# Patient Record
Sex: Female | Born: 1960 | Race: Black or African American | Hispanic: No | Marital: Single | Smoking: Former smoker
Health system: Southern US, Community
[De-identification: ages and names within clinical notes are randomized; demographics above are authoritative.]

## PROBLEM LIST (undated history)

## (undated) DIAGNOSIS — I10 Essential (primary) hypertension: Secondary | ICD-10-CM

## (undated) DIAGNOSIS — K219 Gastro-esophageal reflux disease without esophagitis: Secondary | ICD-10-CM

## (undated) DIAGNOSIS — E7800 Pure hypercholesterolemia, unspecified: Secondary | ICD-10-CM

---

## 2004-10-15 ENCOUNTER — Ambulatory Visit: Payer: Self-pay | Admitting: Internal Medicine

## 2005-03-15 ENCOUNTER — Emergency Department (HOSPITAL_COMMUNITY): Admission: EM | Admit: 2005-03-15 | Discharge: 2005-03-15 | Payer: Self-pay | Admitting: Emergency Medicine

## 2005-10-18 ENCOUNTER — Ambulatory Visit: Payer: Self-pay | Admitting: Internal Medicine

## 2005-10-18 ENCOUNTER — Emergency Department (HOSPITAL_COMMUNITY): Admission: EM | Admit: 2005-10-18 | Discharge: 2005-10-18 | Payer: Self-pay | Admitting: Emergency Medicine

## 2005-11-02 ENCOUNTER — Ambulatory Visit: Payer: Self-pay | Admitting: Internal Medicine

## 2005-12-02 ENCOUNTER — Ambulatory Visit: Payer: Self-pay | Admitting: Internal Medicine

## 2006-04-07 ENCOUNTER — Ambulatory Visit: Payer: Self-pay | Admitting: Internal Medicine

## 2006-04-27 ENCOUNTER — Ambulatory Visit: Payer: Self-pay | Admitting: Gastroenterology

## 2006-05-02 ENCOUNTER — Ambulatory Visit (HOSPITAL_COMMUNITY): Admission: RE | Admit: 2006-05-02 | Discharge: 2006-05-02 | Payer: Self-pay | Admitting: Gastroenterology

## 2006-05-12 ENCOUNTER — Ambulatory Visit: Payer: Self-pay | Admitting: Gastroenterology

## 2006-06-13 ENCOUNTER — Ambulatory Visit: Payer: Self-pay | Admitting: Gastroenterology

## 2006-06-15 ENCOUNTER — Ambulatory Visit (HOSPITAL_COMMUNITY): Admission: RE | Admit: 2006-06-15 | Discharge: 2006-06-15 | Payer: Self-pay | Admitting: Gastroenterology

## 2006-06-27 ENCOUNTER — Ambulatory Visit: Payer: Self-pay | Admitting: Gastroenterology

## 2006-07-08 ENCOUNTER — Ambulatory Visit (HOSPITAL_COMMUNITY): Admission: RE | Admit: 2006-07-08 | Discharge: 2006-07-08 | Payer: Self-pay | Admitting: Gastroenterology

## 2006-07-08 ENCOUNTER — Encounter: Payer: Self-pay | Admitting: Physician Assistant

## 2006-07-20 ENCOUNTER — Ambulatory Visit: Payer: Self-pay | Admitting: Gastroenterology

## 2007-04-12 ENCOUNTER — Encounter: Payer: Self-pay | Admitting: Internal Medicine

## 2007-04-12 DIAGNOSIS — I10 Essential (primary) hypertension: Secondary | ICD-10-CM | POA: Insufficient documentation

## 2007-04-12 DIAGNOSIS — E119 Type 2 diabetes mellitus without complications: Secondary | ICD-10-CM

## 2008-01-16 ENCOUNTER — Telehealth (INDEPENDENT_AMBULATORY_CARE_PROVIDER_SITE_OTHER): Payer: Self-pay | Admitting: *Deleted

## 2008-01-17 ENCOUNTER — Ambulatory Visit: Payer: Self-pay | Admitting: Internal Medicine

## 2008-01-17 DIAGNOSIS — K219 Gastro-esophageal reflux disease without esophagitis: Secondary | ICD-10-CM | POA: Insufficient documentation

## 2008-04-22 ENCOUNTER — Emergency Department (HOSPITAL_COMMUNITY): Admission: EM | Admit: 2008-04-22 | Discharge: 2008-04-22 | Payer: Self-pay | Admitting: Emergency Medicine

## 2008-05-09 ENCOUNTER — Telehealth (INDEPENDENT_AMBULATORY_CARE_PROVIDER_SITE_OTHER): Payer: Self-pay | Admitting: *Deleted

## 2008-05-09 ENCOUNTER — Encounter: Payer: Self-pay | Admitting: Internal Medicine

## 2008-05-10 ENCOUNTER — Telehealth (INDEPENDENT_AMBULATORY_CARE_PROVIDER_SITE_OTHER): Payer: Self-pay | Admitting: *Deleted

## 2009-01-11 ENCOUNTER — Emergency Department (HOSPITAL_COMMUNITY): Admission: EM | Admit: 2009-01-11 | Discharge: 2009-01-11 | Payer: Self-pay | Admitting: Family Medicine

## 2009-04-18 ENCOUNTER — Encounter: Payer: Self-pay | Admitting: Physician Assistant

## 2009-05-12 ENCOUNTER — Ambulatory Visit: Payer: Self-pay | Admitting: Physician Assistant

## 2009-05-12 ENCOUNTER — Telehealth: Payer: Self-pay | Admitting: Physician Assistant

## 2009-05-12 DIAGNOSIS — K029 Dental caries, unspecified: Secondary | ICD-10-CM | POA: Insufficient documentation

## 2009-05-13 ENCOUNTER — Encounter (INDEPENDENT_AMBULATORY_CARE_PROVIDER_SITE_OTHER): Payer: Self-pay | Admitting: *Deleted

## 2009-05-13 ENCOUNTER — Encounter: Payer: Self-pay | Admitting: Physician Assistant

## 2009-05-15 ENCOUNTER — Ambulatory Visit: Payer: Self-pay | Admitting: Internal Medicine

## 2009-05-21 ENCOUNTER — Encounter (INDEPENDENT_AMBULATORY_CARE_PROVIDER_SITE_OTHER): Payer: Self-pay | Admitting: Internal Medicine

## 2009-06-18 ENCOUNTER — Ambulatory Visit: Payer: Self-pay | Admitting: Physician Assistant

## 2009-06-18 DIAGNOSIS — B353 Tinea pedis: Secondary | ICD-10-CM | POA: Insufficient documentation

## 2009-06-18 DIAGNOSIS — L84 Corns and callosities: Secondary | ICD-10-CM | POA: Insufficient documentation

## 2009-06-18 LAB — CONVERTED CEMR LAB
Albumin: 4 g/dL (ref 3.5–5.2)
Alkaline Phosphatase: 84 units/L (ref 39–117)
BUN: 14 mg/dL (ref 6–23)
Basophils Absolute: 0.1 10*3/uL (ref 0.0–0.1)
Blood Glucose, Fingerstick: 102
CO2: 27 meq/L (ref 19–32)
Calcium: 10 mg/dL (ref 8.4–10.5)
Chloride: 107 meq/L (ref 96–112)
Eosinophils Relative: 2 % (ref 0–5)
Glucose, Bld: 90 mg/dL (ref 70–99)
HCT: 38.9 % (ref 36.0–46.0)
Hemoglobin: 12.6 g/dL (ref 12.0–15.0)
Lymphocytes Relative: 57 % — ABNORMAL HIGH (ref 12–46)
Microalb, Ur: 0.5 mg/dL (ref 0.00–1.89)
Monocytes Absolute: 0.4 10*3/uL (ref 0.1–1.0)
Monocytes Relative: 8 % (ref 3–12)
Potassium: 4.2 meq/L (ref 3.5–5.3)
RDW: 14.6 % (ref 11.5–15.5)
TSH: 2.181 microintl units/mL (ref 0.350–4.500)

## 2009-06-19 ENCOUNTER — Encounter: Payer: Self-pay | Admitting: Physician Assistant

## 2009-06-26 ENCOUNTER — Encounter: Payer: Self-pay | Admitting: Physician Assistant

## 2009-08-06 ENCOUNTER — Encounter: Payer: Self-pay | Admitting: Physician Assistant

## 2009-08-06 ENCOUNTER — Ambulatory Visit: Payer: Self-pay | Admitting: Internal Medicine

## 2009-11-19 ENCOUNTER — Encounter: Payer: Self-pay | Admitting: Physician Assistant

## 2009-12-02 ENCOUNTER — Ambulatory Visit: Payer: Self-pay | Admitting: Physician Assistant

## 2010-09-22 NOTE — Letter (Signed)
Summary: REFERAL//PODIATRY//APPT DATE & TIME  REFERAL//PODIATRY//APPT DATE & TIME   Imported By: Arta Bruce 12/03/2009 15:47:53  _____________________________________________________________________  External Attachment:    Type:   Image     Comment:   External Document

## 2010-09-22 NOTE — Assessment & Plan Note (Signed)
Summary: 3 BAD TEETH AND NEED MEDS REFILLED//SS   Vital Signs:  Patient profile:   50 year old female Height:      66 inches Weight:      258 pounds BMI:     41.79 Temp:     98.1 degrees F oral Pulse rate:   75 / minute Pulse rhythm:   regular Resp:     18 per minute BP sitting:   135 / 85  (left arm) Cuff size:   large  Vitals Entered By: [place] Shannon (December 02, 2009 3:02 PM)  Primary Care Provider:  Tereso Newcomer PA-C  CC:  renew meds.... pt has been to denist already...  History of Present Illness: Here for f/u.  Have not seen since Oct.  Eligibility ran out.  GERD:  Never had UGI or saw Dr. Corinda Gubler for EGD.  She is taking OTC Pepcid now.  Still notes problems with dysphagia . . . solids and liquids.  She feels like symptoms are approaching like what she had prior to esoph dilatation in 2007.  She is trying to chew food more, etc.  She is still having quite a bit of indigestion even when she takes the pepcid.  No melena or hematochezia.  No hematemesis.  Weight stable by our scales.  Dental:  Saw dentist yesterday and goes back next week.  DM: Diet controlled.  Occ checks sugars at home . . . 90-100.  Retasure done 04/2009.  Needs pneumovax.      Hypertension History:      She denies headache, chest pain, dyspnea with exertion, and syncope.        Positive major cardiovascular risk factors include diabetes and hypertension.  Negative major cardiovascular risk factors include female age less than 36 years old and non-tobacco-user status.     Problems Prior to Update: 1)  Tinea Pedis  (ICD-110.4) 2)  Callus, Foot  (ICD-700) 3)  Dental Caries  (ICD-521.00) 4)  Gerd  (ICD-530.81) 5)  Hypertension  (ICD-401.9) 6)  Diabetes Mellitus, Type II  (ICD-250.00)  Current Medications (verified): 1)  Atenolol 50 Mg  Tabs (Atenolol) .Marland Kitchen.. 1 By Mouth Qd 2)  Benazepril Hcl 40 Mg Tabs (Benazepril Hcl) .... Take 1 Tablet By Mouth Once A Day 3)  Hydrochlorothiazide 25 Mg Tabs  (Hydrochlorothiazide) .... One Tab By Mouth Once Daily 4)  Prilosec 20 Mg Cpdr (Omeprazole) .... Take 1 Tablet By Mouth Once A Day 5)  Lamisil At Athletes Foot 1 % Crea (Terbinafine Hcl) .... Apply Two Times A Day Until Clear  Allergies (verified): 1)  ! * Spider Bites 2)  ! * Bee Stings  Physical Exam  General:  alert, well-developed, and well-nourished.   Eyes:  pupils equal, pupils round, pupils reactive to light, and no optic disk abnormalities.   Neck:  supple and no thyromegaly.   Lungs:  normal breath sounds.   Heart:  normal rate and regular rhythm.   Abdomen:  soft, non-tender, and no hepatomegaly.   Extremities:  bilat feet with callus formation: left great toe and lateral sole distal to 5th phalanx right great toe Neurologic:  alert & oriented X3 and cranial nerves II-XII intact.   Psych:  normally interactive.     Impression & Recommendations:  Problem # 1:  GERD (ICD-530.81)  just taking pepcid still having dysphagia schedule upper gi series again change to Protonix review at f/u consider sending to GI depending on results of UGI or lack of improvement in symptoms  The following medications were removed from the medication list:    Prilosec 20 Mg Cpdr (Omeprazole) .Marland Kitchen... Take 1 tablet by mouth once a day Her updated medication list for this problem includes:    Protonix 40 Mg Tbec (Pantoprazole sodium) .Marland Kitchen... Take 1 tablet by mouth two times a day  Orders: Diagnostic X-Ray/Fluoroscopy (Diagnostic X-Ray/Flu)  Problem # 2:  HYPERTENSION (ICD-401.9) has had a lot of dental work recently prob up due to pain if cont to be above goal at next visit, increase atenolol to 75 mg once daily (consider adding norvasc)  Her updated medication list for this problem includes:    Atenolol 50 Mg Tabs (Atenolol) .Marland Kitchen... 1 by mouth qd    Benazepril Hcl 40 Mg Tabs (Benazepril hcl) .Marland Kitchen... Take 1 tablet by mouth once a day    Hydrochlorothiazide 25 Mg Tabs (Hydrochlorothiazide)  ..... One tab by mouth once daily  Problem # 3:  DIABETES MELLITUS, TYPE II (ICD-250.00) A1C 6  Her updated medication list for this problem includes:    Benazepril Hcl 40 Mg Tabs (Benazepril hcl) .Marland Kitchen... Take 1 tablet by mouth once a day  Problem # 4:  Preventive Health Care (ICD-V70.0) schedule CPP needs pneumovax  Problem # 5:  CALLUS, FOOT (ICD-700)  refer back to foot clinic at [facility]  Orders: Podiatry Referral (Podiatry)  Complete Medication List: 1)  Atenolol 50 Mg Tabs (Atenolol) .Marland Kitchen.. 1 by mouth qd 2)  Benazepril Hcl 40 Mg Tabs (Benazepril hcl) .... Take 1 tablet by mouth once a day 3)  Hydrochlorothiazide 25 Mg Tabs (Hydrochlorothiazide) .... One tab by mouth once daily 4)  Lamisil At Athletes Foot 1 % Crea (Terbinafine hcl) .... Apply two times a day until clear 5)  Protonix 40 Mg Tbec (Pantoprazole sodium) .... Take 1 tablet by mouth two times a day  Hypertension Assessment/Plan:      The patient's hypertensive risk group is category C: Target organ damage and/or diabetes.  Today's blood pressure is 135/85.  Her blood pressure goal is < 130/80.  Patient Instructions: 1)  Schedule pneumovax. 2)  Please schedule a follow-up appointment in 6 weeks with Zea Kostka for CPP. 3)  Schedule appointment at the podiatry clinic at [facility]. 4)  Your Hemoglobin A1C today is 6.  That is very good. 5)  Get the protonix (pantoprazole) and take two times a day for a week, then take once daily. Prescriptions: HYDROCHLOROTHIAZIDE 25 MG TABS (HYDROCHLOROTHIAZIDE) one tab by mouth once daily  #90 x 3   Entered and Authorized by:   Tereso Newcomer PA-C   Signed by:   Tereso Newcomer PA-C on 12/02/2009   Method used:   Print then Give to Patient   RxID:   [id number] BENAZEPRIL HCL 40 MG TABS (BENAZEPRIL HCL) Take 1 tablet by mouth once a day  #90 x 3   Entered and Authorized by:   Tereso Newcomer PA-C   Signed by:   Tereso Newcomer PA-C on 12/02/2009   Method used:   Print then Give to  Patient   RxID:   [id number] ATENOLOL 50 MG  TABS (ATENOLOL) 1 by mouth qd  #90 x 3   Entered and Authorized by:   Tereso Newcomer PA-C   Signed by:   Tereso Newcomer PA-C on 12/02/2009   Method used:   Print then Give to Patient   RxID:   [id number] PROTONIX 40 MG TBEC (PANTOPRAZOLE SODIUM) Take 1 tablet by mouth two times a day  #60 x 5  Entered and Authorized by:   Tereso Newcomer PA-C   Signed by:   Tereso Newcomer PA-C on 12/02/2009   Method used:   Print then Give to Patient   RxID:   [id number]

## 2010-09-22 NOTE — Letter (Signed)
Summary: PODIATRY  PODIATRY   Imported By: Arta Bruce 01/07/2010 14:31:14  _____________________________________________________________________  External Attachment:    Type:   Image     Comment:   External Document

## 2010-09-22 NOTE — Letter (Signed)
Summary: REFAXED REFERAL TO DENTAL CLINIC  REFAXED REFERAL TO DENTAL CLINIC   Imported By: Arta Bruce 11/19/2009 09:35:47  _____________________________________________________________________  External Attachment:    Type:   Image     Comment:   External Document

## 2011-01-08 NOTE — Assessment & Plan Note (Signed)
[facility]                           GASTROENTEROLOGY OFFICE NOTE   VINETA, CARONE                        MRN:          [id number]  DATE:04/27/2006                            DOB:          Jan 10, 1961    Ms. Samantha Castillo is a 50 year old [place] American female referred through the  courtesy of Dr. Jonny Ruiz for evaluation.  For at least 8 months she has been  complaining of pyrosis.  She has had a burning chest discomfort with  occasional nausea.  She complains of bloating.  Also has a fluttering  feeling in her chest.  She is taking Aciphex with only minimal relief.  Weight has been stable.  She is on no gastric irritants.  She does complain  of intermittent dysphagia to solids.   PAST MEDICAL HISTORY:  1. Diabetes.  2. Hypertension.   FAMILY HISTORY:  Pertinent for diabetes and heart disease in her father.   MEDICATIONS:  Potassium, diltiazem, Lisinopril, and Aciphex.   SHE HAS NO ALLERGIES.   She rarely smokes.  She drinks on occasion.  She does admit to being a heavy  drinker until about a year ago.  She is single and works as a Merchandiser, retail.   REVIEW OF SYSTEMS:  Positive for back pain and muscle cramps.   VITAL SIGNS:  Pulse 60, weight 267.   PHYSICAL EXAMINATION:  HEENT: EOMI. PERRLA. Sclerae are anicteric.  Conjunctivae are pink.  NECK:  Supple without thyromegaly, adenopathy or carotid bruits.  CHEST:  Clear to auscultation and percussion without adventitious sounds.  CARDIAC:  Regular rhythm; normal S1 S2.  There are no murmurs, gallops or  rubs.  ABDOMEN:  There is no succession splash.  Bowel sounds are normoactive.  Abdomen is soft, non-tender and non-distended.  There are no abdominal  masses, tenderness, splenic enlargement or hepatomegaly.  EXTREMITIES:  Full range of motion.  No cyanosis, clubbing or edema.  RECTAL:  Deferred.   IMPRESSION:  1. Gastroesophageal reflux disease.  2. Dysphagia, rule out early esophageal  stricture.  3. Bloating, rule out gastroparesis.   RECOMMENDATION:  1. Continue Aciphex.  2. Upper endoscopy.  3. Gastric emptying scan.                                   Barbette Hair. Arlyce Dice, MD,FACG   RDK/MedQ  DD:  04/27/2006  DT:  04/27/2006  Job #:  [id number]

## 2011-01-08 NOTE — Assessment & Plan Note (Signed)
 [facility]ALTHC[facility]                           GASTROENTEROLOGY OFFICE NOTE   Samantha Castillo, Samantha Castillo                        MRN:          [id number]  DATE:06/13/2006                            DOB:          11-May-1961    PROBLEM:  Dysphagia.   [name] has returned following upper endoscopy and Savary dilatation.  She was dilated to 18 mm.  She continues to report dysphagia to solids and  possibly liquids.  Gastric emptying scan was unremarkable.   EXAM:  Pulse 72, blood pressure 138/82, weight 265.   IMPRESSION:  Persistent dysphagia.  This raises the question of esophageal  motility disorder.   RECOMMENDATIONS:  Barium swallow.  I would consider esophageal manometry  pending results of the above.       Barbette Hair. Arlyce Dice, MD,FACG      RDK/MedQ  DD:  06/13/2006  DT:  06/13/2006  Job #:  [id number]

## 2011-01-08 NOTE — Assessment & Plan Note (Signed)
[facility]                                 ON-CALL NOTE   RIVA, SESMA                        MRN:          [id number]  DATE:12/31/2007                            DOB:          02-02-1961    Patient name is Samantha Castillo.  Date of birth 03/08/1961,  Date of  interaction Dec 31, 2007, at 4 p.m.  Phone Number [phone].   OBJECTIVE:  The patient has pain in her mouth that she assumes is an  abscessed tooth.  The pain started Friday and has gotten progressively  worse, and she said it gets to be 8/10 and then resolves reasonably  quickly thereafter.  She has been taking some Advil.  She has no fever,  just has shooting pain in the mouth.  Has been rinsing her mouth with  peroxide.  Thought that she needed to be on antibiotics prior to being  seen by a dentist.  Basically I told her that the dentist needs to  assess the situation, so she needs to be seen tomorrow.  In the  meantime, she will be taking pain medication for the jaw.  Whether she  needs the antibiotics or not is up to the dentist, and I told her that  treating an abscess is treated by draining the abscess, not by taking  antibiotics necessarily.  Suggested she take her Advil 400 mg every 4  hours with food.  She has stomach problems as she does have reflux, I  told her, if she starts having burning or pain, to stop the Advil.  She  can also alternate Tylenol with the Advil, 2 regular strength on the off  4 hours of her taking the Advil, and if has stomach problems, take  merely Tylenol.  Be sure to see her dentist tomorrow to have the  situation resolved.  Primary care Dniya Neuhaus is Dr. Jonny Ruiz, home office is  [place].     Arta Silence, MD  Electronically Signed    RNS/MedQ  DD: 12/31/2007  DT: 12/31/2007  Job #: [id number]

## 2013-02-10 ENCOUNTER — Emergency Department (HOSPITAL_COMMUNITY)
Admission: EM | Admit: 2013-02-10 | Discharge: 2013-02-10 | Disposition: A | Discharge status: Home / Self Care | Payer: BC Managed Care – PPO | Attending: Emergency Medicine | Admitting: Emergency Medicine

## 2013-02-10 ENCOUNTER — Encounter (HOSPITAL_COMMUNITY): Payer: Self-pay

## 2013-02-10 DIAGNOSIS — N390 Urinary tract infection, site not specified: Secondary | ICD-10-CM | POA: Insufficient documentation

## 2013-02-10 DIAGNOSIS — M545 Low back pain, unspecified: Secondary | ICD-10-CM | POA: Insufficient documentation

## 2013-02-10 DIAGNOSIS — I10 Essential (primary) hypertension: Secondary | ICD-10-CM | POA: Insufficient documentation

## 2013-02-10 DIAGNOSIS — Z79899 Other long term (current) drug therapy: Secondary | ICD-10-CM | POA: Insufficient documentation

## 2013-02-10 DIAGNOSIS — M549 Dorsalgia, unspecified: Secondary | ICD-10-CM

## 2013-02-10 HISTORY — DX: Essential (primary) hypertension: I10

## 2013-02-10 LAB — URINALYSIS, ROUTINE W REFLEX MICROSCOPIC
Bilirubin Urine: NEGATIVE
Ketones, ur: NEGATIVE mg/dL
Nitrite: NEGATIVE
Protein, ur: NEGATIVE mg/dL
Urobilinogen, UA: 1 mg/dL (ref 0.0–1.0)

## 2013-02-10 LAB — URINE MICROSCOPIC-ADD ON

## 2013-02-10 MED ORDER — IBUPROFEN 800 MG PO TABS: 800.0000 mg | ORAL_TABLET | Freq: Three times a day (TID) | ORAL | Status: DC

## 2013-02-10 MED ORDER — HYDROCODONE-ACETAMINOPHEN 5-325 MG PO TABS: 1.0000 | ORAL_TABLET | ORAL | Status: DC | PRN

## 2013-02-10 MED ORDER — CEPHALEXIN 500 MG PO CAPS: 500.0000 mg | ORAL_CAPSULE | Freq: Four times a day (QID) | ORAL | Status: DC

## 2013-02-10 MED ORDER — CYCLOBENZAPRINE HCL 10 MG PO TABS
5.0000 mg | ORAL_TABLET | Freq: Once | ORAL | Status: AC
  Administered 2013-02-10: 5 mg via ORAL
  Filled 2013-02-10: qty 1

## 2013-02-10 MED ORDER — CYCLOBENZAPRINE HCL 10 MG PO TABS: 10.0000 mg | ORAL_TABLET | Freq: Two times a day (BID) | ORAL | Status: DC | PRN

## 2013-02-10 MED ORDER — CYCLOBENZAPRINE HCL 10 MG PO TABS: 10.0000 mg | ORAL_TABLET | Freq: Three times a day (TID) | ORAL | Status: DC | PRN

## 2013-02-10 MED ORDER — OXYCODONE-ACETAMINOPHEN 5-325 MG PO TABS
2.0000 | ORAL_TABLET | Freq: Once | ORAL | Status: AC
  Administered 2013-02-10: 2 via ORAL
  Filled 2013-02-10: qty 2

## 2013-02-10 MED ORDER — HYDROCODONE-ACETAMINOPHEN 5-325 MG PO TABS: 1.0000 | ORAL_TABLET | Freq: Four times a day (QID) | ORAL | Status: DC | PRN

## 2013-02-10 MED ORDER — KETOROLAC TROMETHAMINE 30 MG/ML IJ SOLN
30.0000 mg | Freq: Once | INTRAMUSCULAR | Status: AC
  Administered 2013-02-10: 30 mg via INTRAVENOUS
  Filled 2013-02-10: qty 1

## 2013-02-10 NOTE — ED Provider Notes (Signed)
History     CSN: [id number]  Arrival date & time 02/10/13  0246   First MD Initiated Contact with Patient 02/10/13 0315      Chief Complaint  Patient presents with  . Back Pain   52 yo woman with HTN presents with complaints of low back pain and muscle spasm x 24 hrs. Pain started after the patient vacuumed with a back pain vacuum approx 36 hrs ago. Patient denies any known injury or strain. No history of similar pain. No N/V, fever, GU sx, paresthesias or motor weakness. Pain is worse with walking, relieved somewhat when laying in the left lateral decubitus position.   Pain radiates from right low back to right hip.   (Consider location/radiation/quality/duration/timing/severity/associated sxs/prior treatment) HPI  Past Medical History  Diagnosis Date  . Hypertension     No past surgical history on file.  No family history on file.  History  Substance Use Topics  . Smoking status: Not on file  . Smokeless tobacco: Not on file  . Alcohol Use: Not on file    OB History   Grav Para Term Preterm Abortions TAB SAB Ect Mult Living                  Review of Systems Gen: no weight loss, fevers, chills, night sweats Eyes: no discharge or drainage, no occular pain or visual changes Nose: no epistaxis or rhinorrhea Mouth: no dental pain, no sore throat Neck: no neck pain Lungs: no SOB, cough, wheezing CV: no chest pain, palpitations, dependent edema or orthopnea Abd: no abdominal pain, nausea, vomiting GU: no dysuria or gross hematuria MSK: As per history of present illness, otherwise negative Neuro: no headache, no focal neurologic deficits Skin: no rash Psyche: negative.  Allergies  Bee pollen  Home Medications   Current Outpatient Rx  Name  Route  Sig  Dispense  Refill  . atenolol (TENORMIN) 50 MG tablet   Oral   Take 50 mg by mouth daily.         . benazepril-hydrochlorthiazide (LOTENSIN HCT) 20-12.5 MG per tablet   Oral   Take 2 tablets by mouth  daily.           BP 148/76  Pulse 57  Temp(Src) 98.6 F (37 C) (Oral)  Resp 20  SpO2 99%  Physical Exam Gen: well developed and well nourished appearing Head: NCAT Eyes: PERL, EOMI Nose: no epistaixis or rhinorrhea Mouth/throat: mucosa is moist and pink Neck: supple, no stridor Lungs: CTA B, no wheezing, rhonchi or rales Arts-regular rate and rhythm, no murmur, extremities appear well Abd: soft, notender, nondistended Back: No midline tenderness to palpation, no CVA tenderness, there is tenderness to palpation with reproducible pain and palpable muscle spasm over the paraspinal musculature on the left in the mid to lower lumbar region. Skin: no rashese, wnl Neuro: CN ii-xii grossly intact, no focal deficits, 5 over 5 motor strength in both arms and legs, deep tendon reflexes are brisk and symmetric at the patellar tendons negative straight leg Psyche; normal affect,  calm and cooperative.   ED Course  Procedures (including critical care time)  Patient with sx and exam most consistent with myofascial back pain. We are treating with po pain meds, toradol IM and po flexeril. Will reevaluate in anticipation of discharge home.   Results for orders placed during the hospital encounter of 02/10/13 (from the past 24 hour(s))  URINALYSIS, ROUTINE W REFLEX MICROSCOPIC     Status: Abnormal   Collection  Time    02/10/13  4:15 AM      Result Value Range   Color, Urine YELLOW  YELLOW   APPearance CLOUDY (*) CLEAR   Specific Gravity, Urine 1.018  1.005 - 1.030   pH 6.5  5.0 - 8.0   Glucose, UA NEGATIVE  NEGATIVE mg/dL   Hgb urine dipstick NEGATIVE  NEGATIVE   Bilirubin Urine NEGATIVE  NEGATIVE   Ketones, ur NEGATIVE  NEGATIVE mg/dL   Protein, ur NEGATIVE  NEGATIVE mg/dL   Urobilinogen, UA 1.0  0.0 - 1.0 mg/dL   Nitrite NEGATIVE  NEGATIVE   Leukocytes, UA MODERATE (*) NEGATIVE  URINE MICROSCOPIC-ADD ON     Status: Abnormal   Collection Time    02/10/13  4:15 AM      Result Value  Range   Squamous Epithelial / LPF MANY (*) RARE   WBC, UA 7-10  <3 WBC/hpf   RBC / HPF 0-2  <3 RBC/hpf   Bacteria, UA MANY (*) RARE   Casts HYALINE CASTS (*) NEGATIVE   Urine-Other MUCOUS PRESENT       MDM  Patient feeling better after symptomatic management. Found to have a UTI. We will send urine to culture and tx empirically with Keflex. Patient is stable for discharge with plan for continued symptomatic management and outpatient f/u.         Brandt Loosen, MD 02/10/13 [phone]

## 2013-02-10 NOTE — ED Notes (Signed)
Per PTAR: Pt reports lower back pain x 24 hours. Ambulates with pain. Denies bowel/urinary incontinence. 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 120/90. 78 SR. 98% Ra.

## 2013-02-10 NOTE — ED Notes (Signed)
MD at bedside. 

## 2013-02-10 NOTE — ED Notes (Signed)
Pt comfortable with d/c and f/u instructions. Prescriptions x4

## 2013-02-11 LAB — URINE CULTURE: Colony Count: 25000

## 2013-10-23 ENCOUNTER — Emergency Department (INDEPENDENT_AMBULATORY_CARE_PROVIDER_SITE_OTHER): Payer: BC Managed Care – PPO

## 2013-10-23 ENCOUNTER — Emergency Department (HOSPITAL_COMMUNITY)
Admission: EM | Admit: 2013-10-23 | Discharge: 2013-10-23 | Disposition: A | Discharge status: Home / Self Care | Payer: BC Managed Care – PPO | Source: Home / Self Care | Attending: Emergency Medicine | Admitting: Emergency Medicine

## 2013-10-23 ENCOUNTER — Encounter (HOSPITAL_COMMUNITY): Payer: Self-pay | Admitting: Emergency Medicine

## 2013-10-23 DIAGNOSIS — M722 Plantar fascial fibromatosis: Secondary | ICD-10-CM

## 2013-10-23 MED ORDER — MELOXICAM 15 MG PO TABS: 15.0000 mg | ORAL_TABLET | Freq: Every day | ORAL | Status: DC

## 2013-10-23 MED ORDER — HYDROCODONE-ACETAMINOPHEN 5-325 MG PO TABS: ORAL_TABLET | ORAL | Status: DC

## 2013-10-23 NOTE — ED Notes (Signed)
Pt reports walking down stairs today and heard/felt pop in left ankle/foot.  Having severe pain.  Minimal weight bearing.  States prior to today ankle has been giving her problems off/on.  Pt has tried tylenol with no relief.

## 2013-10-23 NOTE — ED Provider Notes (Signed)
Chief Complaint   Chief Complaint  Patient presents with  . Ankle Injury    History of Present Illness   Samantha Castillo is a 53 year old female who's had a two-week history of plantar heel pain. She denies any injury at that time. It hurts to walk, hurts first thing in the morning, and feels better she is off be completely. There's been no swelling. Today she was just walking at her job and she felt a pop in her foot and the pain became much worse. She now has a good deal difficulty putting any weight on her heel at all.  Review of Systems   Other than as noted above, the patient denies any of the following symptoms: Systemic:  No fevers, chills, or sweats.  No fatigue or tiredness. Musculoskeletal:  No joint pain, arthritis, bursitis, swelling, or back pain.  Neurological:  No muscular weakness, paresthesias.   PMFSH   Past medical history, family history, social history, meds, and allergies were reviewed.   She has high blood pressure. She takes atenolol, Lotensin/HCTZ, Flexeril, and ibuprofen.  Physical  Examination     Vital signs:  BP 132/71  Pulse 60  Temp(Src) 97.7 F (36.5 C) (Oral)  Resp 16  SpO2 100% Gen:  Alert and oriented times 3.  In no distress. Musculoskeletal:  Exam of the foot reveals there is considerable tenderness to palpation of the insertion of the plantar fascia on the calcaneus. No swelling, bruising, or deformity. There is no pain to palpation over the Achilles tendon and the Achilles is intact Thompson's squeeze test. She has mild lateral heel pain. There's no ankle pain or swelling. No pain over the dorsum of the foot or the arch. No pain over the metatarsals. Pulses were full and sensation was intact.  Otherwise, all joints had a full a ROM with no swelling, bruising or deformity.  No edema, pulses full. Extremities were warm and pink.  Capillary refill was brisk.  Skin:  Clear, warm and dry.  No rash. Neuro:  Alert and oriented times 3.  Muscle strength  was normal.  Sensation was intact to light touch.    Radiology   Dg Ankle Complete Left  10/23/2013   CLINICAL DATA:  Injured left ankle.  EXAM: LEFT ANKLE COMPLETE - 3+ VIEW  COMPARISON:  None.  FINDINGS: The ankle mortise is maintained. No acute ankle fracture. No osteochondral abnormality. No joint effusion. Midfoot degenerative changes are noted along with large calcaneal heel spur.  IMPRESSION: No acute fracture.   Electronically Signed   By: Loralie ChampagneMark  Gallerani M.D.   On: 10/23/2013 18:57   Dg Os Calcis Left  10/23/2013   CLINICAL DATA:  Heel pain  EXAM: LEFT OS CALCIS - 2+ VIEW  COMPARISON:  None.  FINDINGS: The visualized soft tissues are unremarkable.  Mild degenerative changes of the dorsal talonavicular joint.  Moderate plantar and small posterior calcaneal enthesophytes.  The visualized soft tissues are unremarkable.  IMPRESSION: No acute osseous abnormality is seen.  Moderate plantar calcaneal enthesophyte.   Electronically Signed   By: Charline BillsSriyesh  Krishnan M.D.   On: 10/23/2013 18:55   I reviewed the images independently and personally and concur with the radiologist's findings.  Course in Urgent Care Center   She was placed in a Cam Walker and given crutches.  Assessment   The encounter diagnosis was Plantar fasciitis.  With probable rupture of plantar fascia.  Plan    1.  Meds:  The following meds were prescribed:  Discharge Medication List as of 10/23/2013  7:12 PM    START taking these medications   Details  !! HYDROcodone-acetaminophen (NORCO/VICODIN) 5-325 MG per tablet 1 to 2 tabs every 4 to 6 hours as needed for pain., Print    meloxicam (MOBIC) 15 MG tablet Take 1 tablet (15 mg total) by mouth daily., Starting 10/23/2013, Until Discontinued, Normal     !! - Potential duplicate medications found. Please discuss with provider.      2.  Patient Education/Counseling:  The patient was given appropriate handouts, self care instructions, and instructed in symptomatic relief  including rest and activity, elevation, application of ice and compression.  She was given exercises to start in about a week. Suggested she followup with podiatry.  3.  Follow up:  The patient was told to follow up here if no better in 3 to 4 days, or sooner if becoming worse in any way, and given some red flag symptoms such as worsening pain or neurological symptoms which would prompt immediate return.  Follow up with Dr. Cristie Hem in one to 2 weeks.       Reuben Likes, MD 10/23/13 [phone]

## 2013-10-23 NOTE — Discharge Instructions (Signed)
Plantar fasciitis is a tendonitis (inflammed tendon) of the the plantar fascia, the tendon on the bottom of the foot that supports the arch.  Often the pain is localized to the heel and can be worse first thing in the morning after getting up or after sitting for a long period of time and tends to improve as the day goes by, only to worsen later on in the afternoon or evening after you have been on your feet for a long time.  Following the program outlined below cures most cases.  If conservative measures like these don't work, corticosteroid injection, or referral to a podiatrist are other options. ° °· Wear well fitting, lace up shoes with good arch support.  Tennis or running shoes are the best.  Do not wear heels, flip-flops, scuffs, or any kind of shoe without adequate support.   °· Use a heel lift.  These can be purchased at a shoe store or pharmacy.  They come in various price ranges.  Some people find that an orthotic insert with arch support works better. °· Wear a night splint.  These can be purchased on line at www.[employer].com.  The product to look for is the "Freedom Dorsal Night Splint."  [employer] also sells other products for plantar fasciitis including heel lifts and orthotic inserts. °· Use of over the counter pain meds can be of help.  Tylenol (or acetaminophen) is the safest to use.  It often helps to take this regularly.  You can take up to 2 325 mg tablets 5 times daily, but it best to start out much lower that that, perhaps 2 325 mg tablets twice daily, then increase from there. People who are on the blood thinner warfarin have to be careful about taking high doses of Tylenol.  For people who are able to tolerate them, ibuprofen and naproxyn can also help with the pain.  You should discuss these agents with your physician before taking them.  People with chronic kidney disease, hypertension, peptic ulcer disease, and reflux can suffer adverse side effects. They should not be taken with warfarin.  The maximum dosage of ibuprofen is 800 mg 3 times daily with meals.  The maximum dosage of naprosyn is 2 and 1/2 tablets twice daily with food, but again, start out low and gradually increase the dose until adequate pain relief is achieved. Ibuprofen and naprosyn should always be taken with food. °· Ice massage is helpful.  The best way to apply this is to freeze a bottle of water, place in on a towel and roll your foot over the bottle to produce an ice massage effect. This can be done as often as every 2 to 3 hours, depending on symptoms.  At the very least, try to do after you have been on your feet for a long period of time and after doing the exercises outlined below. °· Do the exercises outlined below twice daily: ° ° ° ° ° ° ° ° ° ° ° ° ° ° ° ° ° ° °

## 2013-11-09 ENCOUNTER — Ambulatory Visit: Payer: Self-pay

## 2013-11-15 ENCOUNTER — Ambulatory Visit: Payer: Self-pay | Admitting: Podiatry

## 2014-06-11 ENCOUNTER — Encounter (HOSPITAL_COMMUNITY): Payer: Self-pay | Admitting: Emergency Medicine

## 2014-06-11 ENCOUNTER — Emergency Department (HOSPITAL_COMMUNITY)
Admission: EM | Admit: 2014-06-11 | Discharge: 2014-06-12 | Disposition: A | Discharge status: Home / Self Care | Payer: BC Managed Care – PPO | Attending: Emergency Medicine | Admitting: Emergency Medicine

## 2014-06-11 DIAGNOSIS — R350 Frequency of micturition: Secondary | ICD-10-CM | POA: Insufficient documentation

## 2014-06-11 DIAGNOSIS — K5900 Constipation, unspecified: Secondary | ICD-10-CM | POA: Insufficient documentation

## 2014-06-11 DIAGNOSIS — M5432 Sciatica, left side: Secondary | ICD-10-CM | POA: Diagnosis not present

## 2014-06-11 DIAGNOSIS — Z87891 Personal history of nicotine dependence: Secondary | ICD-10-CM | POA: Diagnosis not present

## 2014-06-11 DIAGNOSIS — Z79899 Other long term (current) drug therapy: Secondary | ICD-10-CM | POA: Diagnosis not present

## 2014-06-11 DIAGNOSIS — G5702 Lesion of sciatic nerve, left lower limb: Secondary | ICD-10-CM

## 2014-06-11 DIAGNOSIS — M62838 Other muscle spasm: Secondary | ICD-10-CM | POA: Diagnosis not present

## 2014-06-11 DIAGNOSIS — R300 Dysuria: Secondary | ICD-10-CM | POA: Diagnosis not present

## 2014-06-11 DIAGNOSIS — R252 Cramp and spasm: Secondary | ICD-10-CM

## 2014-06-11 DIAGNOSIS — M79605 Pain in left leg: Secondary | ICD-10-CM | POA: Diagnosis present

## 2014-06-11 DIAGNOSIS — I10 Essential (primary) hypertension: Secondary | ICD-10-CM | POA: Insufficient documentation

## 2014-06-11 MED ORDER — OXYCODONE-ACETAMINOPHEN 5-325 MG PO TABS: 1.0000 | ORAL_TABLET | Freq: Four times a day (QID) | ORAL | Status: DC | PRN

## 2014-06-11 MED ORDER — NAPROXEN 500 MG PO TABS: 500.0000 mg | ORAL_TABLET | Freq: Two times a day (BID) | ORAL | Status: DC | PRN

## 2014-06-11 MED ORDER — KETOROLAC TROMETHAMINE 30 MG/ML IJ SOLN
30.0000 mg | Freq: Once | INTRAMUSCULAR | Status: AC
  Administered 2014-06-11: 30 mg via INTRAMUSCULAR
  Filled 2014-06-11: qty 1

## 2014-06-11 MED ORDER — CYCLOBENZAPRINE HCL 10 MG PO TABS: 10.0000 mg | ORAL_TABLET | Freq: Three times a day (TID) | ORAL | Status: DC | PRN

## 2014-06-11 MED ORDER — PREDNISONE 20 MG PO TABS: ORAL_TABLET | ORAL | Status: AC

## 2014-06-11 NOTE — ED Provider Notes (Signed)
CSN: [id number]     Arrival date & time 06/11/14  2031 History   First MD Initiated Contact with Patient 06/11/14 2229     Chief Complaint  Patient presents with  . Leg Pain    Only Left Upper Leg Cramping.      (Consider location/radiation/quality/duration/timing/severity/associated sxs/prior Treatment) HPI Comments: Samantha Castillo is a 53 y.o. female with a PMHx of HTN, who presents to the ED with complaints of ongoing L buttock/coccyx pain x3 days for which [facility] and diagnosed with UTI, as well as development of L posterior thigh pain starting several hours ago. Pt states 3 days ago she developed gradual onset sharp pain in her coccyx and L buttock, which she reports is 5/10 intermittent sharp pain, originally did not radiate but earlier today began radiating into her L posterior thigh. Pain is worse with standing upright and sitting on her bottom, and relieved by laying on her L side. Has not taken anything for the pain. 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 Endorses intermittent spasms felt in the area of her pain and this radiates down the posterior thigh. Denies numbness, tingling, weakness, paresthesias, cauda equina symptoms, fevers, chills, CP, SOB, abd pain, n/v/d, obstipation, myalgias, LE swelling, rashes, arthralgias, HA, vision changes, syncope, or lightheadedness. Has had back pain in the past, but never had radiating pain such as today. Ambulatory and was walking when this radiating pain began. Has chronic constipation and strains, has hx of hemorrhoids but states this is different. Denies hematochezia or melena.  Diagnosed with UTI yesterday, has taken one dose of abx today. Symptoms of this include increased urinary freq and dysuria, which have not changed since yesterday.  Patient is a 53 y.o. female presenting with back pain. The history is provided by the patient. No language  interpreter was used.  Back Pain Location:  Gluteal region Quality: sharp. Radiates to:  L posterior upper leg Pain severity:  Moderate (5/10) Pain is:  Same all the time Onset quality:  Gradual Duration:  3 days Timing:  Intermittent Progression:  Waxing and waning Chronicity:  Recurrent (hx of back pain previously) Context: lifting heavy objects and twisting   Context comment:  Works as a Advertising copywriterhousekeeper, repetitive twisting and lifting heavy objects Relieved by: lying on L side. Worsened by:  Sitting and standing Ineffective treatments:  None tried Associated symptoms: dysuria ([facility]) and leg pain   Associated symptoms: no abdominal pain, no bladder incontinence, no bowel incontinence, no chest pain, no fever, no headaches, no numbness, no paresthesias, no pelvic pain, no perianal numbness, no tingling, no weakness and no weight loss   Risk factors: obesity   Risk factors: no hx of cancer     Past Medical History  Diagnosis Date  . Hypertension    History reviewed. No pertinent past surgical history. History reviewed. No pertinent family history. History  Substance Use Topics  . Smoking status: Former Games developermoker  . Smokeless tobacco: Not on file  . Alcohol Use: No   OB History   Grav Para Term Preterm Abortions TAB SAB Ect Mult Living                 Review of Systems  Constitutional: Negative for fever, chills, weight loss and unexpected weight change.  Respiratory: Negative for cough and shortness of breath.   Cardiovascular: Negative for chest pain and leg  swelling.  Gastrointestinal: Positive for constipation (chronic and unchanged). Negative for nausea, vomiting, abdominal pain, diarrhea, blood in stool, abdominal distention, anal bleeding and bowel incontinence.  Genitourinary: Positive for dysuria (diagnosed with UTI at [facility] yesterday) and frequency. Negative for bladder incontinence, hematuria, flank pain, decreased urine volume, vaginal  bleeding, vaginal discharge, difficulty urinating and pelvic pain.  Musculoskeletal: Positive for back pain. Negative for arthralgias, gait problem, joint swelling, myalgias and neck pain.  Skin: Negative for rash.  Neurological: Negative for dizziness, tingling, syncope, weakness, numbness, headaches and paresthesias.   10 Systems reviewed and are negative for acute change except as noted in the HPI.    Allergies  Bee pollen  Home Medications   Prior to Admission medications   Medication Sig Start Date End Date Taking? Authorizing Provider  amLODipine (NORVASC) 10 MG tablet Take 10 mg by mouth daily. 05/24/14  Yes Historical Provider, MD  atenolol (TENORMIN) 50 MG tablet Take 50 mg by mouth daily.   Yes Historical Provider, MD  benazepril-hydrochlorthiazide (LOTENSIN HCT) 20-12.5 MG per tablet Take 2 tablets by mouth daily.   Yes Historical Provider, MD  cyclobenzaprine (FLEXERIL) 10 MG tablet Take 1 tablet (10 mg total) by mouth 3 (three) times daily as needed for muscle spasms. 02/10/13  Yes Brandt Loosen, MD  HYDROcodone-acetaminophen (NORCO/VICODIN) 5-325 MG per tablet Take 1-2 tablets by mouth every 4 (four) hours as needed for pain. 02/10/13  Yes Brandt Loosen, MD  Ibuprofen 200 MG CAPS Take 800 mg by mouth 3 (three) times daily as needed (for pain.).   Yes Historical Provider, MD  magnesium gluconate (MAGONATE) 500 MG tablet Take 500 mg by mouth daily.   Yes Historical Provider, MD  sulfamethoxazole-trimethoprim (BACTRIM DS) 800-160 MG per tablet Take 1 tablet by mouth 2 (two) times daily. 3 day therapy course patient began on 06/11/2014.   Yes Historical Provider, MD   BP 137/91  Pulse 84  Temp(Src) 99.2 F (37.3 C) (Oral)  Resp 18  Ht 5\' 8"  (1.727 m)  Wt 260 lb (117.935 kg)  BMI 39.54 kg/m2  SpO2 100% Physical Exam  Nursing note and vitals reviewed. Constitutional: She is oriented to person, place, and time. Vital signs are normal. She appears well-developed and well-nourished.  No distress.  Afebrile, nontoxic, VSS, obese AAF lying on her L side in bed, NAD but appears somewhat uncomfortable  HENT:  Head: Normocephalic and atraumatic.  Mouth/Throat: Oropharynx is clear and moist and mucous membranes are normal.  Eyes: Conjunctivae and EOM are normal. Right eye exhibits no discharge. Left eye exhibits no discharge.  Neck: Normal range of motion. Neck supple.  Cardiovascular: Normal rate, regular rhythm, normal heart sounds and intact distal pulses.  Exam reveals no gallop and no friction rub.   No murmur heard. Distal pulses intact bilaterally  Pulmonary/Chest: Effort normal and breath sounds normal. No respiratory distress. She has no decreased breath sounds. She has no wheezes. She has no rhonchi. She has no rales.  Abdominal: Soft. Normal appearance and bowel sounds are normal. She exhibits no distension. There is no tenderness. There is no rigidity, no rebound, no guarding and no CVA tenderness.  Obese, soft, NTND, +BS throughout, no r/g/r, no CVA TTP  Musculoskeletal: Normal range of motion.       Lumbar back: Normal.       Back:  All spinal levels nonTTP with FROM intact and no paraspinous muscle TTP. L buttock tenderness in piriformis region, fairly obese pt which limits exam and unable to  palpate for spasm. Negative SLR bilaterally, but pt reporting pain running down posterior L thigh with hip extension. Gait nonantalgic. Hips knees and ankles all nonTTP without deformity. Strength 5/5 in all extremities, sensation grossly intact in all extremities.   Neurological: She is alert and oriented to person, place, and time. She has normal strength. No sensory deficit. Gait normal.  Skin: Skin is warm, dry and intact. No rash noted.  No pedal edema, no calf swelling or TTP, no erythema  Psychiatric: She has a normal mood and affect.    ED Course  Procedures (including critical care time) Labs Review Labs Reviewed - No data to display  Imaging Review No results  found.   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 Reports feeling spasms. Although neg SLR, pt reporting symptoms of sciatica-like pain. Reproducible pain in piriformis of L leg. No red flag s/s of low back pain. No s/s of central cord compression or cauda equina. Lower extremities are neurovascularly intact and patient is ambulating without difficulty. No need for imaging at this time. Will give toradol IM now for pain since pt does not have ride home. No need for other labs given that pt was seen at [facility] and diagnosed with UTI, taken one dose of abx, and already had labs which were negative per pt. Doubt this is DVT given no s/sx or risk factors aside from age.   Patient was counseled on back pain precautions and told to do activity as tolerated but do not lift, push, or pull heavy objects more than 10 pounds for the next week. Patient counseled to use heat on piriformis for no longer than 15 minutes every hour.   Rx given for muscle relaxer and counseled on proper use of muscle relaxant medication. Rx given for narcotic pain medicine and counseled on proper use of narcotic pain medications. Told that they can increase to every 4 hrs if needed while pain is worse. Counseled not to combine this medication with others containing tylenol. Urged patient not to drink alcohol, drive, or perform any other activities that requires focus while taking either of these medications. Discussed sustained massage of this area to help with spasm pain. Will also give prednisone burst for sciatica like symptoms.   Patient urged to follow-up with PCP if pain does not improve with treatment and rest or if pain becomes recurrent. Urged to return with worsening severe pain, loss of bowel or bladder control, trouble  walking. The patient verbalizes understanding and agrees with the plan.  BP 184/75  Pulse 69  Temp(Src) 97.7 F (36.5 C) (Oral)  Resp 18  Ht 5\' 8"  (1.727 m)  Wt 260 lb (117.935 kg)  BMI 39.54 kg/m2  SpO2 100%  Meds ordered this encounter  Medications  . ketorolac (TORADOL) 30 MG/ML injection 30 mg    Sig:   . naproxen (NAPROSYN) 500 MG tablet    Sig: Take 1 tablet (500 mg total) by mouth 2 (two) times daily as needed for mild pain, moderate pain or headache (TAKE WITH MEALS.).    Dispense:  20 tablet    Refill:  0    Order Specific Question:  Supervising Provider    Answer:  Eber Hong D [[id number]]  . oxyCODONE-acetaminophen (PERCOCET)  5-325 MG per tablet    Sig: Take 1 tablet by mouth every 6 (six) hours as needed for severe pain.    Dispense:  6 tablet    Refill:  0    Order Specific Question:  Supervising Provider    Answer:  Eber HongMILLER, BRIAN D [[id number]]  . predniSONE (DELTASONE) 20 MG tablet    Sig: 3 tabs po daily x 3 days    Dispense:  9 tablet    Refill:  0    Order Specific Question:  Supervising Provider    Answer:  Eber HongMILLER, BRIAN D [[id number]]  . cyclobenzaprine (FLEXERIL) 10 MG tablet    Sig: Take 1 tablet (10 mg total) by mouth 3 (three) times daily as needed for muscle spasms.    Dispense:  15 tablet    Refill:  0    Order Specific Question:  Supervising Provider    Answer:  Vida RollerMILLER, BRIAN D [address][[id number]]      Samantha Gilcrest Strupp Camprubi-Soms, PA-C 06/11/14 [phone]

## 2014-06-11 NOTE — ED Notes (Signed)
Lower back pain is at the the buttocks. No tenderness noted.

## 2014-06-11 NOTE — ED Notes (Signed)
Pt states she developed left upper leg cramping about 2-3 hrs ago. [place]. Also, complains of lower back pain that has occurred earlier in the year. Denies numbness or tingling in left leg.

## 2014-06-11 NOTE — Discharge Instructions (Signed)
Back Pain:  Your back/buttock pain should be treated with medicines such as ibuprofen or aleve and this back pain should get better over the next 2 weeks.  However if you develop severe or worsening pain, low back pain with fever, numbness, weakness or inability to walk or urinate, you should return to the ER immediately.  Please follow up with your doctor this week for a recheck if still having symptoms.  Your pain is likely related to piriformis spasm/strain, which can cause some pain in the coccyx (tailbone) as well.  Self - care:  The application of heat can help soothe the pain.  Maintaining your daily activities, including walking, is encourged, as it will help you get better faster than just staying in bed. Perform gentle stretching as discussed. Drink plenty of fluids.  Medications are also useful to help with pain control.  A commonly prescribed medications includes percocet. This medication will make you drowsy, don't drive while taking it.  Non steroidal anti inflammatory medications including Ibuprofen and naproxen;  These medications help both pain and swelling and are very useful in treating back pain.  They should be taken with food, as they can cause stomach upset, and more seriously, stomach bleeding.    Muscle relaxants (flexeril):  These medications can help with muscle tightness that is a cause of lower back pain.  Most of these medications can cause drowsiness, and it is not safe to drive or use dangerous machinery while taking them.  Prednisone: this pain helps relieve inflammation and radiating nerve pain such as the pain in your leg. Take with breakfast.  SEEK IMMEDIATE MEDICAL ATTENTION IF: New numbness, tingling, weakness, or problem with the use of your arms or legs.  Severe back pain not relieved with medications.  Difficulty with or loss of control of your bowel or bladder control.  Increasing pain in any areas of the body (such as chest or abdominal pain).    Shortness of breath, dizziness or fainting.  Nausea (feeling sick to your stomach), vomiting, fever, or sweats.  You will need to follow up with  Your primary healthcare provider in 1-2 weeks for reassessment.   Piriformis Syndrome with Rehab Piriformis syndrome is a condition the affects the nervous system in the area of the hip, and is characterized by pain and possibly a loss of feeling in the backside (posterior) thigh that may extend down the entire length of the leg. The symptoms are caused by an increase in pressure on the sciatic nerve by the piriformis muscle, which is on the back of the hip and is responsible for externally rotating the hip. The sciatic nerve and its branches connect to much of the leg. Normally the sciatic nerve runs between the piriformis muscle and other muscles. However, in certain individuals the nerve runs through the muscle, which causes an increase in pressure on the nerve and results in the symptoms of piriformis syndrome. SYMPTOMS   Pain, tingling, numbness, or burning in the back of the thigh that may also extend down the entire leg.  Occasionally, tenderness in the buttock.  Loss of function of the leg.  Pain that worsens when using the piriformis muscle (running, jumping, or stairs).  Pain that increases with prolonged sitting.  Pain that is lessened by lying flat on the back. CAUSES   Piriformis syndrome is the result of an increase in pressure placed on the sciatic nerve. Oftentimes, piriformis syndrome is an overuse injury.  Stress placed on the nerve from a  sudden increase in the intensity, frequency, or duration of training.  Compensation of other extremity injuries. RISK INCREASES WITH:  Sports that involve the piriformis muscle (running, walking, or jumping).  You are born with (congenital) a defect in which the sciatic nerve passes through the muscle. PREVENTION  Warm up and stretch properly before activity.  Allow for adequate  recovery between workouts.  Maintain physical fitness:  Strength, flexibility, and endurance.  Cardiovascular fitness. PROGNOSIS  If treated properly, the symptoms of piriformis syndrome usually resolve in 2 to 6 weeks. RELATED COMPLICATIONS   Persistent and possibly permanent pain and numbness in the lower extremity.  Weakness of the extremity that may progress to disability and inability to compete. TREATMENT  The most effective treatment for piriformis syndrome is rest from any activities that aggravate the symptoms. Ice and pain medication may help reduce pain and inflammation. The use of strengthening and stretching exercises may help reduce pain with activity. These exercises may be performed at home or with a therapist. A referral to a therapist may be given for further evaluation and treatment, such as ultrasound. Corticosteroid injections may be given to reduce inflammation that is causing pressure to be placed on the sciatic nerve. If nonsurgical (conservative) treatment is unsuccessful, then surgery may be recommended.  MEDICATION   If pain medication is necessary, then nonsteroidal anti-inflammatory medications, such as aspirin and ibuprofen, or other minor pain relievers, such as acetaminophen, are often recommended.  Do not take pain medication for 7 days before surgery.  Prescription pain relievers may be given if deemed necessary by your caregiver. Use only as directed and only as much as you need.  Corticosteroid injections may be given by your caregiver. These injections should be reserved for the most serious cases, because they may only be given a certain number of times. HEAT AND COLD:   Cold treatment (icing) relieves pain and reduces inflammation. Cold treatment should be applied for 10 to 15 minutes every 2 to 3 hours for inflammation and pain and immediately after any activity that aggravates your symptoms. Use ice packs or massage the area with a piece of ice (ice  massage).  Heat treatment may be used prior to performing the stretching and strengthening activities prescribed by your caregiver, physical therapist, or athletic trainer. Use a heat pack or soak the injury in warm water. SEEK IMMEDIATE MEDICAL CARE IF:  Treatment seems to offer no benefit, or the condition worsens.  Any medications produce adverse side effects. EXERCISES RANGE OF MOTION (ROM) AND STRETCHING EXERCISES - Piriformis Syndrome These exercises may help you when beginning to rehabilitate your injury. Your symptoms may resolve with or without further involvement from your physician, physical therapist, or athletic trainer. While completing these exercises, remember:   Restoring tissue flexibility helps normal motion to return to the joints. This allows healthier, less painful movement and activity.  An effective stretch should be held for at least 30 seconds.  A stretch should never be painful. You should only feel a gentle lengthening or release in the stretched tissue. STRETCH - Hip Rotators  Lie on your back on a firm surface. Grasp your right / left knee with your right / left hand and your ankle with your opposite hand.  Keeping your hips and shoulders firmly planted, gently pull your right / left knee and rotate your lower leg toward your opposite shoulder until you feel a stretch in your buttocks.  Hold this stretch for __________ seconds. Repeat this stretch __________ times.  Complete this stretch __________ times per day. STRETCH - Iliotibial Band  On the floor or bed, lie on your side so your right / left leg is on top. Bend your knee and grab your ankle.  Slowly bring your knee back so that your thigh is in line with your trunk. Keep your heel at your buttocks and gently arch your back so your head, shoulders, and hips line up.  Slowly lower your leg so that your knee approaches the floor/bed until you feel a gentle stretch on the outside of your right / left  thigh. If you do not feel a stretch and your knee will not fall farther, place the heel of your opposite foot on top of your knee and pull your thigh down farther.  Hold this stretch for __________ seconds. Repeat __________ times. Complete __________ times per day. STRENGTHENING EXERCISES - Piriformis Syndrome  These are some of the caregiver again or until your symptoms are resolved. Remember:   Strong muscles with good endurance tolerate stress better.  Do the exercises as initially prescribed by your caregiver. Progress slowly with each exercise, gradually increasing the number of repetitions and weight used under their guidance. STRENGTH - Hip Abductors, Straight Leg Raises Be aware of your form throughout the entire exercise so that you exercise the correct muscles. Sloppy form means that you are not strengthening the correct muscles.  Lie on your side so that your head, shoulders, knee, and hip line up. You may bend your lower knee to help maintain your balance. Your right / left leg should be on top.  Roll your hips slightly forward, so that your hips are stacked directly over each other and your right / left knee is facing forward.  Lift your top leg up 4-6 inches, leading with your heel. Be sure that your foot does not drift forward or that your knee does not roll toward the ceiling.  Hold this position for __________ seconds. You should feel the muscles in your outer hip lifting (you may not notice this until your leg begins to tire).  Slowly lower your leg to the starting position. Allow the muscles to fully relax before beginning the next repetition. Repeat __________ times. Complete this exercise __________ times per day.  STRENGTH - Hip Abductors, Quadruped  On a firm, lightly padded surface, position yourself on your hands and knees. Your hands should be directly below your shoulders and your knees should be directly below your hips.  Keeping your right / left knee bent,  lift your leg out to the side. Keep your legs level and in line with your shoulders.  Position yourself on your hands and knees.  Hold for __________ seconds.  Keeping your trunk steady and your hips level, slowly lower your leg to the starting position. Repeat __________ times. Complete this exercise __________ times per day.  STRENGTH - Hip Abductors, Standing  Tie one end of a rubber exercise band/tubing to a secure surface (table, pole) and tie a loop at the other end.  Place the loop around your right / left ankle. Keeping your ankle with the band directly opposite of the secured end, step away until there is tension in the tube/band.  Hold onto a chair as needed for balance.  Keeping your back upright, your shoulders over your hips, and your toes pointing forward, lift your right / left leg out to your side. Be sure to lift your leg with your hip muscles. Do not "throw" your leg or  tip your body to lift your leg.  Slowly and with control, return to the starting position. Repeat exercise __________ times. Complete this exercise __________ times per day.  Document Released: 08/09/2005 Document Revised: 12/24/2013 Document Reviewed: 11/21/2008 [facility] Patient Information 2015 [place], [place]. This information is not intended to replace advice given to you by your health care provider. Make sure you discuss any questions you have with your health care provider.   Sciatica Sciatica is pain, weakness, numbness, or tingling along your sciatic nerve. The nerve starts in the lower back and runs down the back of each leg. Nerve damage or certain conditions pinch or put pressure on the sciatic nerve. This causes the pain, weakness, and other discomforts of sciatica. HOME CARE   Only take medicine as told by your doctor.  Apply ice to the affected area for 20 minutes. Do this 3-4 times a day for the first 48-72 hours. Then try heat in the same way.  Exercise, stretch, or do your usual  activities if these do not make your pain worse.  Go to physical therapy as told by your doctor.  Keep all doctor visits as told.  Do not wear high heels or shoes that are not supportive.  Get a firm mattress if your mattress is too soft to lessen pain and discomfort. GET HELP RIGHT AWAY IF:   You cannot control when you poop (bowel movement) or pee (urinate).  You have more weakness in your lower back, lower belly (pelvis), butt (buttocks), or legs.  You have redness or puffiness (swelling) of your back.  You have a burning feeling when you pee.  You have pain that gets worse when you lie down.  You have pain that wakes you from your sleep.  Your pain is worse than past pain.  Your pain lasts longer than 4 weeks.  You are suddenly losing weight without reason. MAKE SURE YOU:   Understand these instructions.  Will watch this condition.  Will get help right away if you are not doing well or get worse. Document Released: 05/18/2008 Document Revised: 02/08/2012 Document Reviewed: 12/19/2011 [facility] Patient Information 2015 [place], [place]. This information is not intended to replace advice given to you by your health care provider. Make sure you discuss any questions you have with your health care provider. Radicular Pain Radicular pain in either the arm or leg is usually from a bulging or herniated disk in the spine. A piece of the herniated disk may press against the nerves as the nerves exit the spine. This causes pain which is felt at the tips of the nerves down the arm or leg. Other causes of radicular pain may include:  Fractures.  Heart disease.  Cancer.  An abnormal and usually degenerative state of the nervous system or nerves (neuropathy). Diagnosis may require CT or MRI scanning to determine the primary cause.  Nerves that start at the neck (nerve roots) may cause radicular pain in the outer shoulder and arm. It can spread down to the thumb and fingers. The  symptoms vary depending on which nerve root has been affected. In most cases radicular pain improves with conservative treatment. Neck problems may require physical therapy, a neck collar, or cervical traction. Treatment may take many weeks, and surgery may be considered if the symptoms do not improve.  Conservative treatment is also recommended for sciatica. Sciatica causes pain to radiate from the lower back or buttock area down the leg into the foot. Often there is a history  of back problems. Most patients with sciatica are better after 2 to 4 weeks of rest and other supportive care. Short term bed rest can reduce the disk pressure considerably. Sitting, however, is not a good position since this increases the pressure on the disk. You should avoid bending, lifting, and all other activities which make the problem worse. Traction can be used in severe cases. Surgery is usually reserved for patients who do not improve within the first months of treatment. Only take over-the-counter or prescription medicines for pain, discomfort, or fever as directed by your caregiver. Narcotics and muscle relaxants may help by relieving more severe pain and spasm and by providing mild sedation. Cold or massage can give significant relief. Spinal manipulation is not recommended. It can increase the degree of disc protrusion. Epidural steroid injections are often effective treatment for radicular pain. These injections deliver medicine to the spinal nerve in the space between the protective covering of the spinal cord and back bones (vertebrae). Your caregiver can give you more information about steroid injections. These injections are most effective when given within two weeks of the onset of pain.  You should see your caregiver for follow up care as recommended. A program for neck and back injury rehabilitation with stretching and strengthening exercises is an important part of management.  SEEK IMMEDIATE MEDICAL CARE  IF:  You develop increased pain, weakness, or numbness in your arm or leg.  You develop difficulty with bladder or bowel control.  You develop abdominal pain. Document Released: 09/16/2004 Document Revised: 11/01/2011 Document Reviewed: 12/02/2008 [facility] Patient Information 2015 [place], [place]. This information is not intended to replace advice given to you by your health care provider. Make sure you discuss any questions you have with your health care provider.  Muscle Cramps and Spasms Muscle cramps and spasms are when muscles tighten by themselves. They usually get better within minutes. Muscle cramps are painful. They are usually stronger and last longer than muscle spasms. Muscle spasms may or may not be painful. They can last a few seconds or much longer. HOME CARE  Drink enough fluid to keep your pee (urine) clear or pale yellow.  Massage, stretch, and relax the muscle.  Use a warm towel, heating pad, or warm shower water on tight muscles.  Place ice on the muscle if it is tender or in pain.  Put ice in a plastic bag.  Place a towel between your skin and the bag.  Leave the ice on for 15-20 minutes, 03-04 times a day.  Only take medicine as told by your doctor. GET HELP RIGHT AWAY IF:  Your cramps or spasms get worse, happen more often, or do not get better with time. MAKE SURE YOU:  Understand these instructions.  Will watch your condition.  Will get help right away if you are not doing well or get worse. Document Released: 07/22/2008 Document Revised: 12/04/2012 Document Reviewed: 07/26/2012 [facility] Patient Information 2015 [place], [place]. This information is not intended to replace advice given to you by your health care provider. Make sure you discuss any questions you have with your health care provider.  Back Exercises Back exercises help treat and prevent back injuries. The goal is to increase your strength in your belly (abdominal) and back muscles. These  exercises can also help with flexibility. Start these exercises when told by your doctor. HOME CARE Back exercises include: Pelvic Tilt.  Lie on your back with your knees bent. Tilt your pelvis until the lower part of  your back is against the floor. Hold this position 5 to 10 sec. Repeat this exercise 5 to 10 times. Knee to Chest.  Pull 1 knee up against your chest and hold for 20 to 30 seconds. Repeat this with the other knee. This may be done with the other leg straight or bent, whichever feels better. Then, pull both knees up against your chest. Sit-Ups or Curl-Ups.  Bend your knees 90 degrees. Start with tilting your pelvis, and do a partial, slow sit-up. Only lift your upper half 30 to 45 degrees off the floor. Take at least 2 to 3 seonds for each sit-up. Do not do sit-ups with your knees out straight. If partial sit-ups are difficult, simply do the above but with only tightening your belly (abdominal) muscles and holding it as told. Hip-Lift.  Lie on your back with your knees flexed 90 degrees. Push down with your feet and shoulders as you raise your hips 2 inches off the floor. Hold for 10 seconds, repeat 5 to 10 times. Back Arches.  Lie on your stomach. Prop yourself up on bent elbows. Slowly press on your hands, causing an arch in your low back. Repeat 3 to 5 times. Shoulder-Lifts.  Lie face down with arms beside your body. Keep hips and belly pressed to floor as you slowly lift your head and shoulders off the floor. Do not overdo your exercises. Be careful in the beginning. Exercises may cause you some mild back discomfort. If the pain lasts for more than 15 minutes, stop the exercises until you see your doctor. Improvement with exercise for back problems is slow.  Document Released: 09/11/2010 Document Revised: 11/01/2011 Document Reviewed: 06/10/2011 [facility] Patient Information 2015 [place], [place]. This information is not intended to replace advice given to you by your health  care provider. Make sure you discuss any questions you have with your health care provider.

## 2014-06-11 NOTE — ED Notes (Signed)
[facility] for same [facility] and dx with urinary tract infection and vaginal infection. Given medication dx. Pt reports taking one dose of antibiotic.

## 2014-06-12 NOTE — ED Provider Notes (Signed)
Medical screening examination/treatment/procedure(s) were performed by non-physician practitioner and as supervising physician I was immediately available for consultation/collaboration.   EKG Interpretation None       Sujata Maines M Leeya Rusconi, MD 06/12/14 0718 

## 2014-11-14 IMAGING — CR DG ANKLE COMPLETE 3+V*L*
3 series · 3 of 3 positions shown · non-contrast
Comparison: None.

CLINICAL DATA: Injured left ankle.

EXAM:
LEFT ANKLE COMPLETE - 3+ VIEW

[view not recorded (1 of 3)]
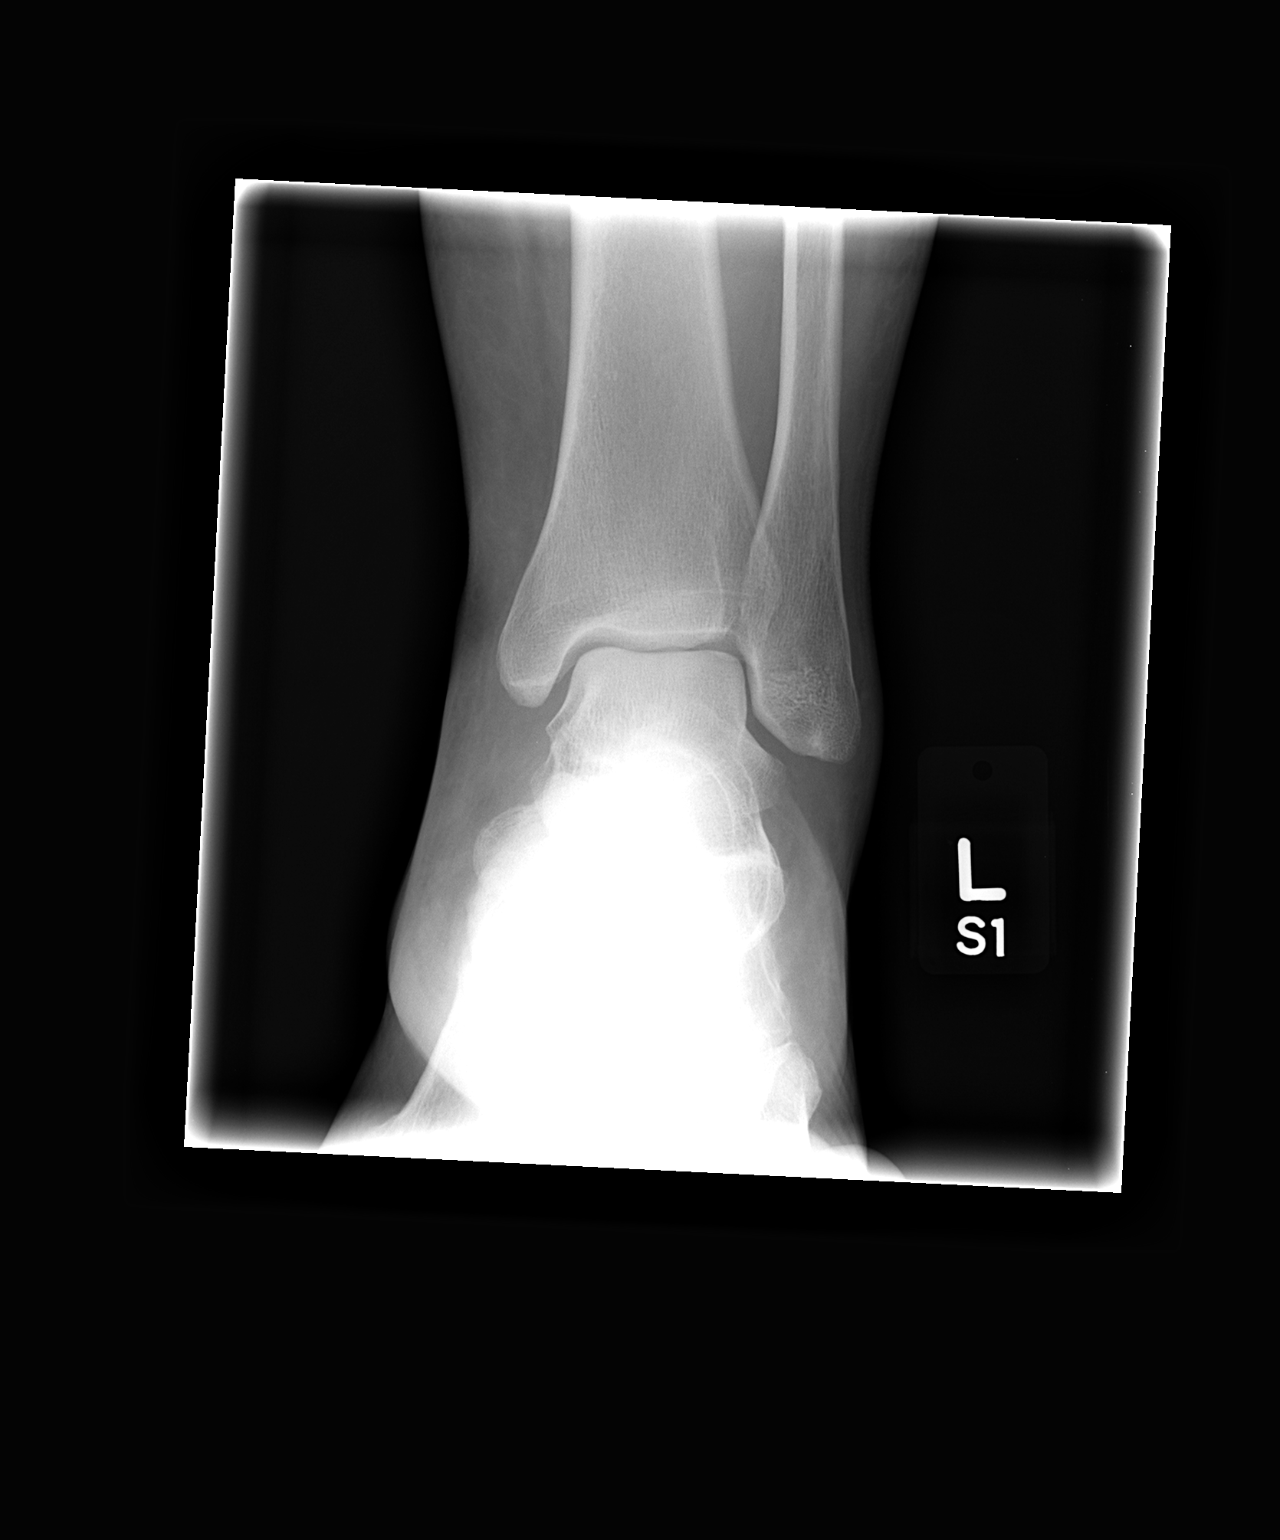

[view not recorded (2 of 3)]
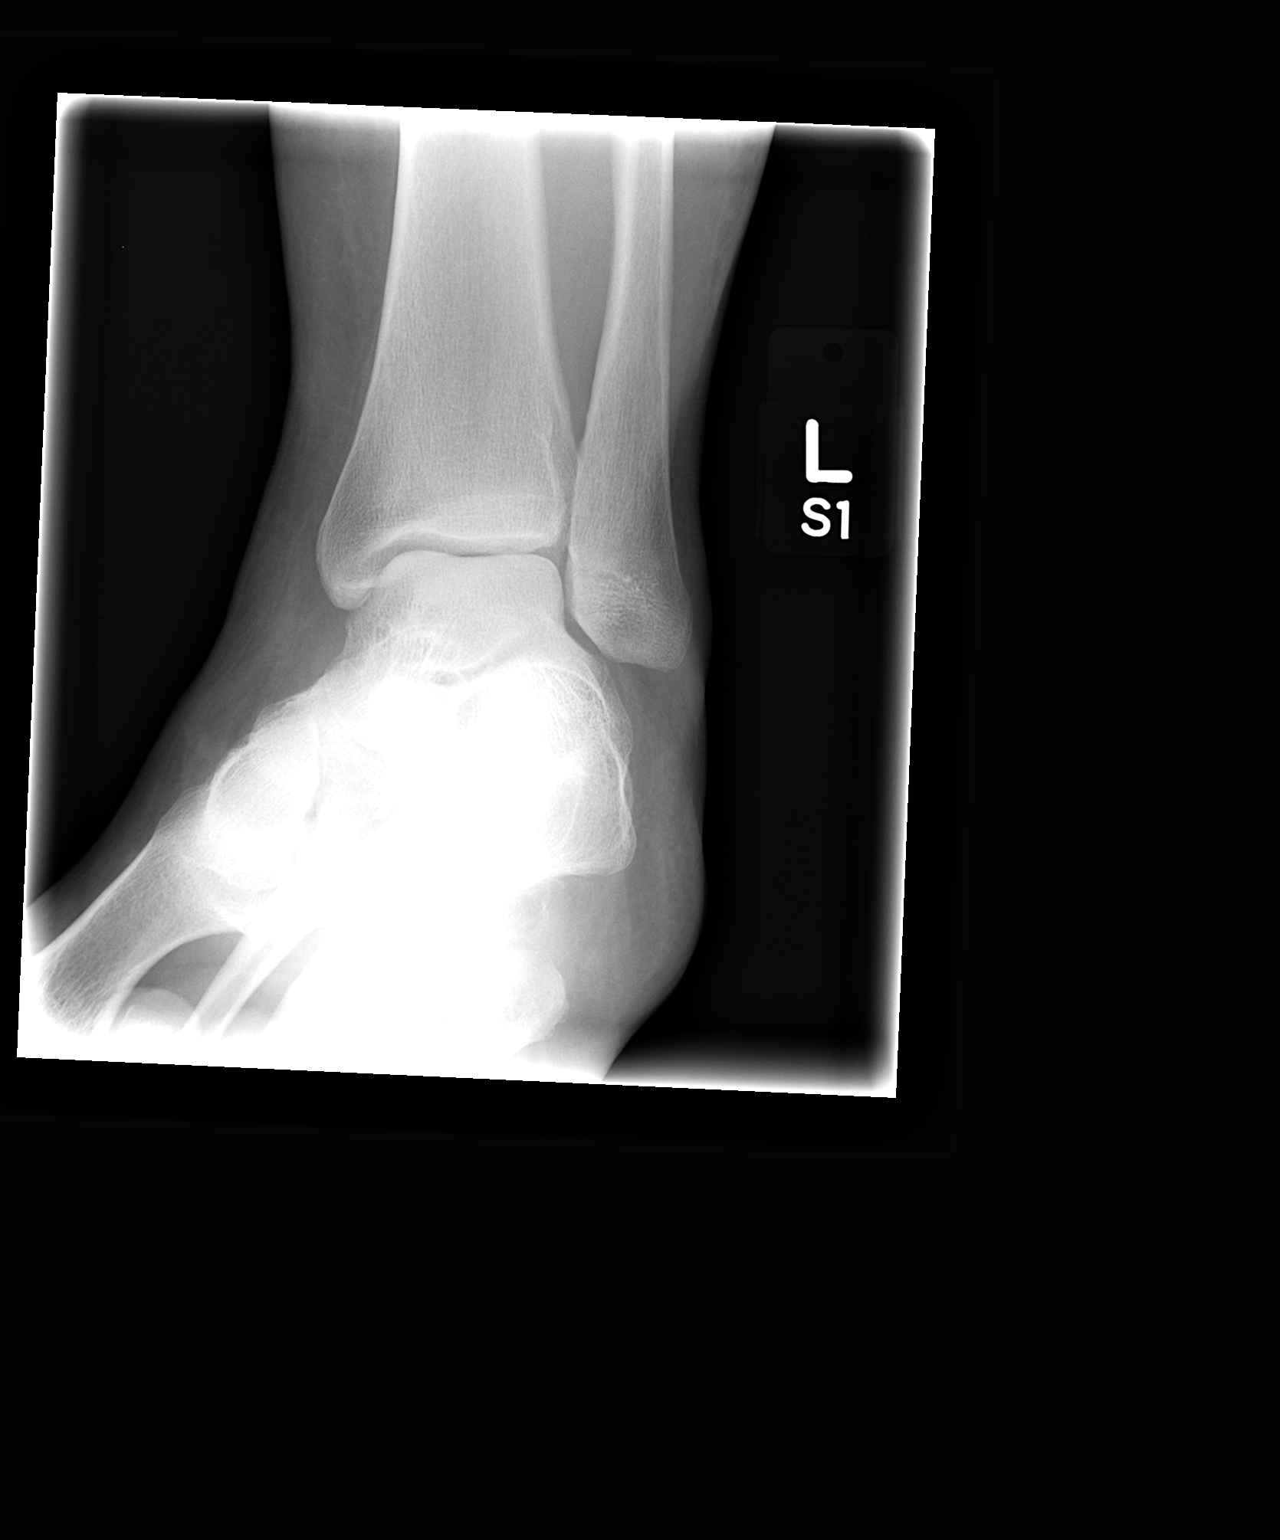

[view not recorded (3 of 3)]
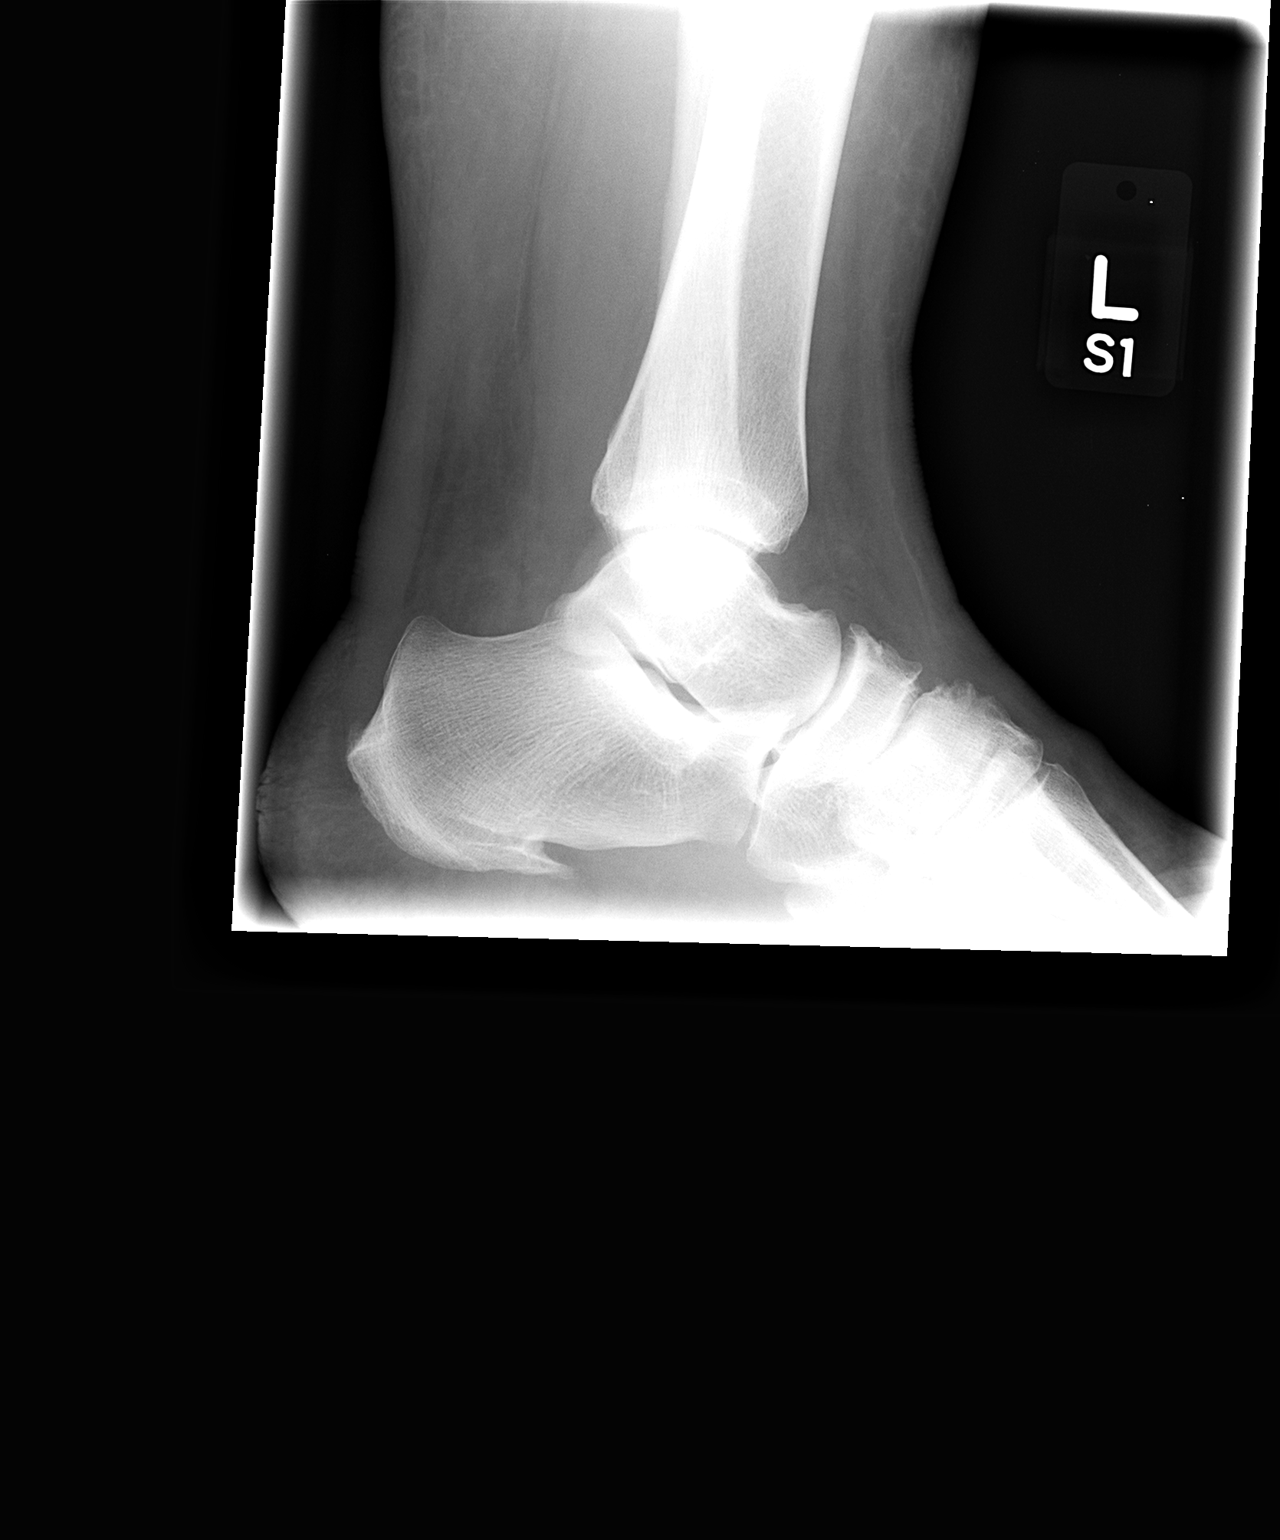

[3 of 3 positions shown; findings below may reference images not displayed]

FINDINGS: The ankle mortise is maintained. No acute ankle fracture. No
osteochondral abnormality. No joint effusion. Midfoot degenerative
changes are noted along with large calcaneal heel spur.
IMPRESSION: No acute fracture.

## 2023-10-08 ENCOUNTER — Ambulatory Visit (HOSPITAL_COMMUNITY)
Admission: EM | Admit: 2023-10-08 | Discharge: 2023-10-08 | Disposition: A | Discharge status: Home / Self Care | Payer: 59 | Attending: Emergency Medicine | Admitting: Emergency Medicine

## 2023-10-08 ENCOUNTER — Other Ambulatory Visit: Payer: Self-pay

## 2023-10-08 ENCOUNTER — Encounter (HOSPITAL_COMMUNITY): Payer: Self-pay | Admitting: *Deleted

## 2023-10-08 DIAGNOSIS — T148XXA Other injury of unspecified body region, initial encounter: Secondary | ICD-10-CM | POA: Diagnosis not present

## 2023-10-08 MED ORDER — DEXAMETHASONE SODIUM PHOSPHATE 10 MG/ML IJ SOLN
10.0000 mg | Freq: Once | INTRAMUSCULAR | Status: AC
  Administered 2023-10-08: 10 mg via INTRAMUSCULAR

## 2023-10-08 MED ORDER — BACLOFEN 10 MG PO TABS: 10.0000 mg | ORAL_TABLET | Freq: Three times a day (TID) | ORAL | 0 refills | Status: AC

## 2023-10-08 MED ORDER — DEXAMETHASONE SODIUM PHOSPHATE 10 MG/ML IJ SOLN
INTRAMUSCULAR | Status: AC
  Filled 2023-10-08: qty 1

## 2023-10-08 NOTE — ED Triage Notes (Signed)
 PT has RT leg and thigh pain. Pain first started a week ago . Pain worse yesterday. PT denies any injury.

## 2023-10-08 NOTE — ED Provider Notes (Signed)
 [facility]    CSN: [id number] Arrival date & time: 10/08/23  1736      History   Chief Complaint Chief Complaint  Patient presents with   Leg Pain    HPI Samantha Castillo is a 63 y.o. female.   Patient presents with right hip and groin pain that began about a week ago.  Patient states that pain was initially intermittent but is now constant.  Patient describes pain as a tightness or pulling sensation upon walking.  Denies any known injury.  Denies numbness, tingling, and weakness.   Leg Pain   Past Medical History:  Diagnosis Date   Hypertension     Patient Active Problem List   Diagnosis Date Noted   TINEA PEDIS 06/18/2009   CALLUS, FOOT 06/18/2009   Dental caries 05/12/2009   GERD 01/17/2008   DIABETES MELLITUS, TYPE II 04/12/2007   Essential hypertension 04/12/2007    History reviewed. No pertinent surgical history.  OB History   No obstetric history on file.      Home Medications    Prior to Admission medications   Medication Sig Start Date End Date Taking? Authorizing Provider  amLODipine (NORVASC) 10 MG tablet Take 10 mg by mouth daily. 05/24/14  Yes [provider]  baclofen (LIORESAL) 10 MG tablet Take 1 tablet (10 mg total) by mouth 3 (three) times daily. 10/08/23  Yes Susann Givens, Tarynn Garling A, NP  benazepril-hydrochlorthiazide (LOTENSIN HCT) 20-12.5 MG per tablet Take 2 tablets by mouth daily.   Yes [provider]  Ibuprofen 200 MG CAPS Take 800 mg by mouth 3 (three) times daily as needed (for pain.).   Yes [provider]  magnesium gluconate (MAGONATE) 500 MG tablet Take 500 mg by mouth daily.    [provider]  predniSONE (DELTASONE) 20 MG tablet 3 tabs po daily x 3 days 06/11/14   Street, [place], [place]    Family History History reviewed. No pertinent family history.  Social History Social History   Tobacco Use   Smoking status: Former  Substance Use Topics   Alcohol use: No   Drug use: No      Allergies   Bee pollen   Review of Systems Review of Systems  Per HPI  Physical Exam Triage Vital Signs ED Triage Vitals  Encounter Vitals Group     BP 10/08/23 1807 (!) 164/106     Systolic BP Percentile --      Diastolic BP Percentile --      Pulse Rate 10/08/23 1807 62     Resp 10/08/23 1807 18     Temp 10/08/23 1807 98.8 F (37.1 C)     Temp src --      SpO2 10/08/23 1807 96 %     Weight --      Height --      Head Circumference --      Peak Flow --      Pain Score 10/08/23 1804 4     Pain Loc --      Pain Education --      Exclude from Growth Chart --    No data found.  Updated Vital Signs BP (!) 164/106   Pulse 62   Temp 98.8 F (37.1 C)   Resp 18   SpO2 96%   Visual Acuity Right Eye Distance:   Left Eye Distance:   Bilateral Distance:    Right Eye Near:   Left Eye Near:    Bilateral Near:  Physical Exam Vitals and nursing note reviewed.  Constitutional:      General: She is awake. She is not in acute distress.    Appearance: Normal appearance. She is well-developed and well-groomed. She is not ill-appearing.  Musculoskeletal:     Right hip: Normal.     Right upper leg: Tenderness present.     Right knee: Normal.  Neurological:     Mental Status: She is alert.  Psychiatric:        Behavior: Behavior is cooperative.      UC Treatments / Results  Labs (all labs ordered are listed, but only abnormal results are displayed) Labs Reviewed - No data to display  EKG   Radiology No results found.  Procedures Procedures (including critical care time)  Medications Ordered in UC Medications  dexamethasone (DECADRON) injection 10 mg (10 mg Intramuscular Given 10/08/23 1838)    Initial Impression / Assessment and Plan / UC Course  I have reviewed the triage vital signs and the nursing notes.  Pertinent labs & imaging results that were available during my care of the patient were reviewed by me and considered in my medical  decision making (see chart for details).     Patient presented with right hip and groin pain that began about a week ago.  Patient states the pain was initially intermittent and felt like spasms, but is now constant.  Describes pain as a tightness or pulling sensation upon walking.  Denies any known injury.  Upon assessment patient is mildly tender to right upper leg and groin area without tenderness to hip.  Patient walks with a slight limp due to pain.  Given Decadron injection in clinic to help decrease inflammation that may be causing her pain.  Prescribed baclofen to help with muscle pain and spasms.  Discussed follow-up and return precautions. Final Clinical Impressions(s) / UC Diagnoses   Final diagnoses:  Muscle strain     Discharge Instructions      You were given a steroid injection in clinic today to help decrease inflammation that may be causing her pain.  I have prescribed baclofen which is a muscle relaxer that you can take up to 3 times daily as needed for muscle pain and spasms.  This can make you drowsy so do not drive, work, or drink while taking.  Otherwise alternate between Tylenol and ibuprofen as needed for pain.  You can also alternate heat and ice as needed for pain.  Return here as needed.  I have also attached an orthopedic doctor that you can follow-up with if pain persist.    ED Prescriptions     Medication Sig Dispense Auth. Provider   baclofen (LIORESAL) 10 MG tablet Take 1 tablet (10 mg total) by mouth 3 (three) times daily. 30 each [name], NP      PDMP not reviewed this encounter.   Wynonia Lawman A, NP 10/08/23 [phone]

## 2023-10-08 NOTE — Discharge Instructions (Signed)
 You were given a steroid injection in clinic today to help decrease inflammation that may be causing her pain.  I have prescribed baclofen which is a muscle relaxer that you can take up to 3 times daily as needed for muscle pain and spasms.  This can make you drowsy so do not drive, work, or drink while taking.  Otherwise alternate between Tylenol and ibuprofen as needed for pain.  You can also alternate heat and ice as needed for pain.  Return here as needed.  I have also attached an orthopedic doctor that you can follow-up with if pain persist.

## 2024-05-01 ENCOUNTER — Ambulatory Visit (HOSPITAL_COMMUNITY): Admission: EM | Admit: 2024-05-01 | Discharge: 2024-05-01 | Disposition: A | Discharge status: Home / Self Care

## 2024-05-01 ENCOUNTER — Encounter (HOSPITAL_COMMUNITY): Payer: Self-pay | Admitting: *Deleted

## 2024-05-01 DIAGNOSIS — M545 Low back pain, unspecified: Secondary | ICD-10-CM | POA: Diagnosis not present

## 2024-05-01 DIAGNOSIS — I10 Essential (primary) hypertension: Secondary | ICD-10-CM

## 2024-05-01 HISTORY — DX: Gastro-esophageal reflux disease without esophagitis: K21.9

## 2024-05-01 HISTORY — DX: Pure hypercholesterolemia, unspecified: E78.00

## 2024-05-01 MED ORDER — DEXAMETHASONE SODIUM PHOSPHATE 10 MG/ML IJ SOLN
10.0000 mg | Freq: Once | INTRAMUSCULAR | Status: AC
  Administered 2024-05-01: 10 mg via INTRAMUSCULAR

## 2024-05-01 MED ORDER — DEXAMETHASONE SODIUM PHOSPHATE 10 MG/ML IJ SOLN
INTRAMUSCULAR | Status: AC
Start: 2024-05-01 — End: 2024-05-01
  Filled 2024-05-01: qty 1

## 2024-05-01 NOTE — Discharge Instructions (Signed)
 You have received injection of Decadron  in clinic today which is a steroid to help with inflammation related to your pain. Otherwise recommend alternating between 500 mg of Tylenol  and 400 to 600 mg of ibuprofen  every 6-8 hours as needed for pain. Alternate between ice and heat and do some gentle stretching to help with pain. Follow-up with your primary care provider or return here as needed. Be sure you are taking your blood pressure medication daily as prescribed.

## 2024-05-01 NOTE — ED Provider Notes (Signed)
 [facility]    CSN: [id number] Arrival date & time: 05/01/24  1759      History   Chief Complaint Chief Complaint  Patient presents with   Back Pain    HPI Samantha Castillo is a 63 y.o. female.   Patient presents with right lower back pain that began yesterday.  Patient denies any recent falls or known injuries.  Patient reports that the pain is worse with movement.  Patient denies any urinary symptoms.  Patient denies taking medication for her symptoms.  Of note patient's blood pressure is elevated in clinic today.  Patient does have a history of high blood pressure.  Patient states that she has not taken her blood pressure medications today as prescribed.  Patient states that she is not out of her blood pressure medications, she just did not take these today.  The history is provided by the patient and medical records.  Back Pain   Past Medical History:  Diagnosis Date   GERD (gastroesophageal reflux disease)    Hypercholesterolemia    Hypertension     Patient Active Problem List   Diagnosis Date Noted   TINEA PEDIS 06/18/2009   CALLUS, FOOT 06/18/2009   Dental caries 05/12/2009   GERD 01/17/2008   DIABETES MELLITUS, TYPE II 04/12/2007   Essential hypertension 04/12/2007    History reviewed. No pertinent surgical history.  OB History   No obstetric history on file.      Home Medications    Prior to Admission medications   Medication Sig Start Date End Date Taking? Authorizing Provider  atorvastatin (LIPITOR) 20 MG tablet Take 20 mg by mouth. 04/17/24 10/14/24 Yes [provider]  benazepril-hydrochlorthiazide (LOTENSIN HCT) 20-12.5 MG per tablet Take 2 tablets by mouth daily.   Yes [provider]  Cholecalciferol 10 MCG (400 UNIT) CHEW Chew by mouth.   Yes [provider]  EPINEPHrine 0.3 mg/0.3 mL IJ SOAJ injection Inject 0.3 mg into the skin. 01/30/19  Yes [provider]  labetalol (NORMODYNE) 200 MG tablet Take  200 mg by mouth 2 (two) times daily.   Yes [provider]  omeprazole (PRILOSEC) 40 MG capsule Take by mouth daily. 11/22/23  Yes [provider]  potassium chloride (KLOR-CON) 10 MEQ tablet Take 1 tablet by mouth 2 (two) times daily. 11/02/22  Yes [provider]  amLODipine (NORVASC) 10 MG tablet Take 10 mg by mouth daily. 05/24/14   [provider]  baclofen  (LIORESAL ) 10 MG tablet Take 1 tablet (10 mg total) by mouth 3 (three) times daily. 10/08/23   Johnie Flaming A, NP  Ibuprofen  200 MG CAPS Take 800 mg by mouth 3 (three) times daily as needed (for pain.).    [provider]  magnesium gluconate (MAGONATE) 500 MG tablet Take 500 mg by mouth daily.    [provider]  predniSONE  (DELTASONE ) 20 MG tablet 3 tabs po daily x 3 days 06/11/14   Street, [place], [place]    Family History History reviewed. No pertinent family history.  Social History Social History   Tobacco Use   Smoking status: Former  Building services engineer status: Never Used  Substance Use Topics   Alcohol use: No   Drug use: No     Allergies   Bee pollen   Review of Systems Review of Systems  Musculoskeletal:  Positive for back pain.   Per HPI  Physical Exam Triage Vital Signs ED Triage Vitals  Encounter Vitals Group  BP 05/01/24 1911 (!) 174/110     Girls Systolic BP Percentile --      Girls Diastolic BP Percentile --      Boys Systolic BP Percentile --      Boys Diastolic BP Percentile --      Pulse Rate 05/01/24 1911 60     Resp 05/01/24 1911 18     Temp 05/01/24 1911 98 F (36.7 C)     Temp Source 05/01/24 1911 Oral     SpO2 05/01/24 1911 97 %     Weight --      Height --      Head Circumference --      Peak Flow --      Pain Score 05/01/24 1907 6     Pain Loc --      Pain Education --      Exclude from Growth Chart --    No data found.  Updated Vital Signs BP (!) 174/110 (BP Location: Left Arm) Comment: took meds today, couple  hours ago  Pulse 60   Temp 98 F (36.7 C) (Oral)   Resp 18   SpO2 97%   Visual Acuity Right Eye Distance:   Left Eye Distance:   Bilateral Distance:    Right Eye Near:   Left Eye Near:    Bilateral Near:     Physical Exam Vitals and nursing note reviewed.  Constitutional:      General: She is awake. She is not in acute distress.    Appearance: Normal appearance. She is well-developed and well-groomed. She is not ill-appearing.  Musculoskeletal:     Cervical back: Normal.     Thoracic back: Normal.     Lumbar back: Tenderness present. No swelling, deformity or signs of trauma. Normal range of motion. Negative right straight leg raise test and negative left straight leg raise test.       Back:     Comments: Tenderness noted to right low back.  Skin:    General: Skin is warm and dry.  Neurological:     Mental Status: She is alert.  Psychiatric:        Behavior: Behavior is cooperative.      UC Treatments / Results  Labs (all labs ordered are listed, but only abnormal results are displayed) Labs Reviewed - No data to display  EKG   Radiology No results found.  Procedures Procedures (including critical care time)  Medications Ordered in UC Medications  dexamethasone  (DECADRON ) injection 10 mg (10 mg Intramuscular Given 05/01/24 1952)    Initial Impression / Assessment and Plan / UC Course  I have reviewed the triage vital signs and the nursing notes.  Pertinent labs & imaging results that were available during my care of the patient were reviewed by me and considered in my medical decision making (see chart for details).     Patient is overall well-appearing.  Vitals are stable.  Back pain likely muscular in nature.  Given IM Decadron  in clinic.  Recommended alternate between Tylenol  ibuprofen  as needed for pain.  Discussed follow-up and return precautions. Final Clinical Impressions(s) / UC Diagnoses   Final diagnoses:  Acute right-sided low back pain  without sciatica  Elevated blood pressure reading in office with diagnosis of hypertension     Discharge Instructions      You have received injection of Decadron  in clinic today which is a steroid to help with inflammation related to your pain. Otherwise recommend alternating between 500 mg  of Tylenol  and 400 to 600 mg of ibuprofen  every 6-8 hours as needed for pain. Alternate between ice and heat and do some gentle stretching to help with pain. Follow-up with your primary care provider or return here as needed. Be sure you are taking your blood pressure medication daily as prescribed.   ED Prescriptions   None    PDMP not reviewed this encounter.   Johnie Flaming A, NP 05/01/24 2026

## 2024-05-01 NOTE — ED Triage Notes (Signed)
 Pt states she has right lower back pain since yesterday, she has not taken any meds for sx.
